# Patient Record
Sex: Male | Born: 1995 | Race: White | Hispanic: No | Marital: Married | Smoking: Current every day smoker
Health system: Southern US, Community
[De-identification: ages and names within clinical notes are randomized; demographics above are authoritative.]

## PROBLEM LIST (undated history)

## (undated) DIAGNOSIS — Z789 Other specified health status: Secondary | ICD-10-CM

## (undated) DIAGNOSIS — W3400XA Accidental discharge from unspecified firearms or gun, initial encounter: Secondary | ICD-10-CM

## (undated) HISTORY — PX: OTHER SURGICAL HISTORY: SHX169

---
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
## 2019-06-03 ENCOUNTER — Emergency Department (HOSPITAL_COMMUNITY): Payer: Self-pay

## 2019-06-03 ENCOUNTER — Encounter (HOSPITAL_COMMUNITY): Payer: Self-pay

## 2019-06-03 ENCOUNTER — Encounter (HOSPITAL_COMMUNITY)
Admission: EM | Disposition: A | Discharge status: Home / Self Care | Payer: Self-pay | Source: Home / Self Care | Attending: Vascular Surgery

## 2019-06-03 ENCOUNTER — Emergency Department (HOSPITAL_COMMUNITY): Payer: Self-pay | Admitting: Certified Registered"

## 2019-06-03 ENCOUNTER — Other Ambulatory Visit: Payer: Self-pay

## 2019-06-03 ENCOUNTER — Inpatient Hospital Stay (HOSPITAL_COMMUNITY)
Admission: EM | Admit: 2019-06-03 | Discharge: 2019-06-06 | DRG: 907 | Disposition: A | Discharge status: Home / Self Care | Payer: Self-pay | Attending: Vascular Surgery | Admitting: Vascular Surgery

## 2019-06-03 DIAGNOSIS — M79651 Pain in right thigh: Secondary | ICD-10-CM

## 2019-06-03 DIAGNOSIS — I998 Other disorder of circulatory system: Secondary | ICD-10-CM | POA: Diagnosis present

## 2019-06-03 DIAGNOSIS — S71131A Puncture wound without foreign body, right thigh, initial encounter: Secondary | ICD-10-CM | POA: Diagnosis present

## 2019-06-03 DIAGNOSIS — Z20828 Contact with and (suspected) exposure to other viral communicable diseases: Secondary | ICD-10-CM | POA: Diagnosis present

## 2019-06-03 DIAGNOSIS — Y909 Presence of alcohol in blood, level not specified: Secondary | ICD-10-CM | POA: Diagnosis present

## 2019-06-03 DIAGNOSIS — I743 Embolism and thrombosis of arteries of the lower extremities: Secondary | ICD-10-CM | POA: Diagnosis present

## 2019-06-03 DIAGNOSIS — W3400XA Accidental discharge from unspecified firearms or gun, initial encounter: Secondary | ICD-10-CM

## 2019-06-03 DIAGNOSIS — F17220 Nicotine dependence, chewing tobacco, uncomplicated: Secondary | ICD-10-CM | POA: Diagnosis present

## 2019-06-03 DIAGNOSIS — I959 Hypotension, unspecified: Secondary | ICD-10-CM | POA: Diagnosis present

## 2019-06-03 DIAGNOSIS — S75111A Minor laceration of femoral vein at hip and thigh level, right leg, initial encounter: Secondary | ICD-10-CM | POA: Diagnosis present

## 2019-06-03 DIAGNOSIS — R0989 Other specified symptoms and signs involving the circulatory and respiratory systems: Secondary | ICD-10-CM

## 2019-06-03 DIAGNOSIS — F10129 Alcohol abuse with intoxication, unspecified: Secondary | ICD-10-CM | POA: Diagnosis present

## 2019-06-03 DIAGNOSIS — S75001A Unspecified injury of femoral artery, right leg, initial encounter: Secondary | ICD-10-CM

## 2019-06-03 DIAGNOSIS — S75091A Other specified injury of femoral artery, right leg, initial encounter: Principal | ICD-10-CM | POA: Diagnosis present

## 2019-06-03 HISTORY — DX: Other specified health status: Z78.9

## 2019-06-03 HISTORY — PX: FEMORAL-POPLITEAL BYPASS GRAFT: SHX937

## 2019-06-03 HISTORY — DX: Accidental discharge from unspecified firearms or gun, initial encounter: W34.00XA

## 2019-06-03 LAB — CBC
HCT: 40.5 % (ref 39.0–52.0)
Hemoglobin: 13.3 g/dL (ref 13.0–17.0)
MCH: 32 pg (ref 26.0–34.0)
MCHC: 32.8 g/dL (ref 30.0–36.0)
MCV: 97.6 fL (ref 80.0–100.0)
Platelets: 290 10*3/uL (ref 150–400)
RBC: 4.15 MIL/uL — ABNORMAL LOW (ref 4.22–5.81)
RDW: 12.3 % (ref 11.5–15.5)
WBC: 14.1 10*3/uL — ABNORMAL HIGH (ref 4.0–10.5)
nRBC: 0 % (ref 0.0–0.2)

## 2019-06-03 LAB — ETHANOL: Alcohol, Ethyl (B): 203 mg/dL — ABNORMAL HIGH (ref <10)

## 2019-06-03 LAB — COMPREHENSIVE METABOLIC PANEL
ALT: 16 U/L (ref 0–44)
AST: 17 U/L (ref 15–41)
Albumin: 3.6 g/dL (ref 3.5–5.0)
Alkaline Phosphatase: 50 U/L (ref 38–126)
Anion gap: 16 — ABNORMAL HIGH (ref 5–15)
BUN: 14 mg/dL (ref 6–20)
CO2: 13 mmol/L — ABNORMAL LOW (ref 22–32)
Calcium: 7.8 mg/dL — ABNORMAL LOW (ref 8.9–10.3)
Chloride: 108 mmol/L (ref 98–111)
Creatinine, Ser: 1.7 mg/dL — ABNORMAL HIGH (ref 0.61–1.24)
GFR calc Af Amer: >60 mL/min (ref >60)
GFR calc non Af Amer: 55 mL/min — ABNORMAL LOW (ref >60)
Glucose, Bld: 285 mg/dL — ABNORMAL HIGH (ref 70–99)
Potassium: 2.7 mmol/L — CL (ref 3.5–5.1)
Sodium: 137 mmol/L (ref 135–145)
Total Bilirubin: 0.4 mg/dL (ref 0.3–1.2)
Total Protein: 5.9 g/dL — ABNORMAL LOW (ref 6.5–8.1)

## 2019-06-03 LAB — SARS CORONAVIRUS 2 BY RT PCR (HOSPITAL ORDER, PERFORMED IN ~~LOC~~ HOSPITAL LAB): SARS Coronavirus 2: NEGATIVE

## 2019-06-03 LAB — I-STAT CHEM 8, ED
BUN: 15 mg/dL (ref 6–20)
Calcium, Ion: 1 mmol/L — ABNORMAL LOW (ref 1.15–1.40)
Chloride: 106 mmol/L (ref 98–111)
Creatinine, Ser: 1.8 mg/dL — ABNORMAL HIGH (ref 0.61–1.24)
Glucose, Bld: 282 mg/dL — ABNORMAL HIGH (ref 70–99)
HCT: 41 % (ref 39.0–52.0)
Hemoglobin: 13.9 g/dL (ref 13.0–17.0)
Potassium: 2.8 mmol/L — ABNORMAL LOW (ref 3.5–5.1)
Sodium: 139 mmol/L (ref 135–145)
TCO2: 15 mmol/L — ABNORMAL LOW (ref 22–32)

## 2019-06-03 LAB — PROTIME-INR
INR: 1.2 (ref 0.8–1.2)
Prothrombin Time: 15 seconds (ref 11.4–15.2)

## 2019-06-03 LAB — LACTIC ACID, PLASMA: Lactic Acid, Venous: 5.7 mmol/L (ref 0.5–1.9)

## 2019-06-03 LAB — CDS SEROLOGY

## 2019-06-03 SURGERY — BYPASS GRAFT FEMORAL-POPLITEAL ARTERY
Anesthesia: General | Site: Leg Upper | Laterality: Right

## 2019-06-03 MED ORDER — SUFENTANIL CITRATE 50 MCG/ML IV SOLN
INTRAVENOUS | Status: AC
  Filled 2019-06-03: qty 1

## 2019-06-03 MED ORDER — PROPOFOL 10 MG/ML IV BOLUS
INTRAVENOUS | Status: AC
  Filled 2019-06-03: qty 20

## 2019-06-03 MED ORDER — SODIUM CHLORIDE 0.9 % IV SOLN
INTRAVENOUS | Status: AC
  Filled 2019-06-03: qty 1.2

## 2019-06-03 MED ORDER — TETANUS-DIPHTH-ACELL PERTUSSIS 5-2.5-18.5 LF-MCG/0.5 IM SUSP
0.5000 mL | Freq: Once | INTRAMUSCULAR | Status: AC
  Administered 2019-06-03: 0.5 mL via INTRAMUSCULAR
  Filled 2019-06-03: qty 0.5

## 2019-06-03 MED ORDER — THROMBIN (RECOMBINANT) 20000 UNITS EX SOLR
CUTANEOUS | Status: AC
  Filled 2019-06-03: qty 20000

## 2019-06-03 MED ORDER — MIDAZOLAM HCL 2 MG/2ML IJ SOLN
INTRAMUSCULAR | Status: AC
  Filled 2019-06-03: qty 2

## 2019-06-03 SURGICAL SUPPLY — 54 items
BNDG ELASTIC 4X5.8 VLCR STR LF (GAUZE/BANDAGES/DRESSINGS) IMPLANT
CANISTER SUCT 3000ML PPV (MISCELLANEOUS) ×3 IMPLANT
CATH EMB 3FR 80CM (CATHETERS) ×2 IMPLANT
CLIP VESOCCLUDE MED 24/CT (CLIP) ×3 IMPLANT
CLIP VESOCCLUDE SM WIDE 24/CT (CLIP) ×3 IMPLANT
COVER WAND RF STERILE (DRAPES) ×1 IMPLANT
DERMABOND ADVANCED (GAUZE/BANDAGES/DRESSINGS) ×2
DERMABOND ADVANCED .7 DNX12 (GAUZE/BANDAGES/DRESSINGS) ×1 IMPLANT
DRAIN CHANNEL 15F RND FF W/TCR (WOUND CARE) IMPLANT
DRAPE X-RAY CASS 24X20 (DRAPES) IMPLANT
DRSG COVADERM 4X10 (GAUZE/BANDAGES/DRESSINGS) ×2 IMPLANT
ELECT REM PT RETURN 9FT ADLT (ELECTROSURGICAL) ×3
ELECTRODE REM PT RTRN 9FT ADLT (ELECTROSURGICAL) ×1 IMPLANT
EVACUATOR SILICONE 100CC (DRAIN) IMPLANT
GAUZE SPONGE 4X4 12PLY STRL LF (GAUZE/BANDAGES/DRESSINGS) ×2 IMPLANT
GLOVE BIO SURGEON STRL SZ7.5 (GLOVE) ×3 IMPLANT
GLOVE BIOGEL PI IND STRL 6.5 (GLOVE) IMPLANT
GLOVE BIOGEL PI IND STRL 7.0 (GLOVE) IMPLANT
GLOVE BIOGEL PI IND STRL 7.5 (GLOVE) IMPLANT
GLOVE BIOGEL PI INDICATOR 6.5 (GLOVE) ×2
GLOVE BIOGEL PI INDICATOR 7.0 (GLOVE) ×2
GLOVE BIOGEL PI INDICATOR 7.5 (GLOVE) ×2
GLOVE ECLIPSE 7.0 STRL STRAW (GLOVE) ×2 IMPLANT
GLOVE SURG SS PI 7.5 STRL IVOR (GLOVE) ×2 IMPLANT
GOWN STRL REUS W/ TWL LRG LVL3 (GOWN DISPOSABLE) ×2 IMPLANT
GOWN STRL REUS W/ TWL XL LVL3 (GOWN DISPOSABLE) ×1 IMPLANT
GOWN STRL REUS W/TWL LRG LVL3 (GOWN DISPOSABLE) ×4
GOWN STRL REUS W/TWL XL LVL3 (GOWN DISPOSABLE) ×2
HEMOSTAT SNOW SURGICEL 2X4 (HEMOSTASIS) IMPLANT
KIT BASIN OR (CUSTOM PROCEDURE TRAY) ×3 IMPLANT
KIT TURNOVER KIT B (KITS) ×3 IMPLANT
MARKER GRAFT CORONARY BYPASS (MISCELLANEOUS) IMPLANT
NS IRRIG 1000ML POUR BTL (IV SOLUTION) ×6 IMPLANT
PACK PERIPHERAL VASCULAR (CUSTOM PROCEDURE TRAY) ×3 IMPLANT
PAD ARMBOARD 7.5X6 YLW CONV (MISCELLANEOUS) ×6 IMPLANT
SET COLLECT BLD 21X3/4 12 (NEEDLE) IMPLANT
STOPCOCK 4 WAY LG BORE MALE ST (IV SETS) IMPLANT
SUT ETHILON 3 0 PS 1 (SUTURE) IMPLANT
SUT PROLENE 5 0 C 1 24 (SUTURE) ×5 IMPLANT
SUT PROLENE 6 0 BV (SUTURE) ×5 IMPLANT
SUT SILK 3 0 (SUTURE)
SUT SILK 3-0 18XBRD TIE 12 (SUTURE) IMPLANT
SUT VIC AB 2-0 CT1 27 (SUTURE) ×4
SUT VIC AB 2-0 CT1 TAPERPNT 27 (SUTURE) ×2 IMPLANT
SUT VIC AB 3-0 SH 27 (SUTURE) ×4
SUT VIC AB 3-0 SH 27X BRD (SUTURE) ×2 IMPLANT
SUT VICRYL 4-0 PS2 18IN ABS (SUTURE) ×4 IMPLANT
SYR 3ML LL SCALE MARK (SYRINGE) ×2 IMPLANT
TAPE CLOTH SURG 4X10 WHT LF (GAUZE/BANDAGES/DRESSINGS) ×2 IMPLANT
TOWEL GREEN STERILE (TOWEL DISPOSABLE) ×3 IMPLANT
TRAY FOLEY MTR SLVR 16FR STAT (SET/KITS/TRAYS/PACK) IMPLANT
TUBING EXTENTION W/L.L. (IV SETS) IMPLANT
UNDERPAD 30X30 (UNDERPADS AND DIAPERS) ×3 IMPLANT
WATER STERILE IRR 1000ML POUR (IV SOLUTION) ×3 IMPLANT

## 2019-06-03 NOTE — ED Notes (Signed)
Vascular at bedside

## 2019-06-03 NOTE — ED Notes (Signed)
Paged Vascular surg to Dr Dian Situ
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
## 2019-06-03 NOTE — ED Notes (Signed)
Waiting on COVID result then pt to go to OR, tourniquet in place. Pt resting comfortably, easily aroused

## 2019-06-03 NOTE — Consult Note (Signed)
Responded to page, pt unavailable, no family present, staff will page again if further chaplain services needed.  Rev. Ludwig Tugwell Chaplain 

## 2019-06-03 NOTE — Anesthesia Preprocedure Evaluation (Signed)
Anesthesia Evaluation  Patient identified by MRN, date of birth, ID band Patient awake    Reviewed: Allergy & Precautions, NPO status , Patient's Chart, lab work & pertinent test results  Airway Mallampati: II  TM Distance: >3 FB Neck ROM: Full    Dental no notable dental hx. (+) Dental Advisory Given   Pulmonary Current Smoker,    Pulmonary exam normal        Cardiovascular negative cardio ROS Normal cardiovascular exam     Neuro/Psych negative neurological ROS  negative psych ROS   GI/Hepatic negative GI ROS, Neg liver ROS,   Endo/Other  negative endocrine ROS  Renal/GU negative Renal ROS  negative genitourinary   Musculoskeletal negative musculoskeletal ROS (+)   Abdominal   Peds negative pediatric ROS (+)  Hematology negative hematology ROS (+)   Anesthesia Other Findings   Reproductive/Obstetrics negative OB ROS                             Anesthesia Physical Anesthesia Plan  ASA: II and emergent  Anesthesia Plan: General   Post-op Pain Management:    Induction: Intravenous, Rapid sequence and Cricoid pressure planned  PONV Risk Score and Plan: 2  Airway Management Planned: Oral ETT  Additional Equipment:   Intra-op Plan:   Post-operative Plan: Extubation in OR  Informed Consent: I have reviewed the patients History and Physical, chart, labs and discussed the procedure including the risks, benefits and alternatives for the proposed anesthesia with the patient or authorized representative who has indicated his/her understanding and acceptance.     Dental advisory given  Plan Discussed with: CRNA and Anesthesiologist  Anesthesia Plan Comments:         Anesthesia Quick Evaluation
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
## 2019-06-04 ENCOUNTER — Encounter (HOSPITAL_COMMUNITY): Payer: Self-pay | Admitting: Vascular Surgery

## 2019-06-04 DIAGNOSIS — W3400XA Accidental discharge from unspecified firearms or gun, initial encounter: Secondary | ICD-10-CM

## 2019-06-04 DIAGNOSIS — Y249XXA Unspecified firearm discharge, undetermined intent, initial encounter: Secondary | ICD-10-CM

## 2019-06-04 DIAGNOSIS — S75091A Other specified injury of femoral artery, right leg, initial encounter: Secondary | ICD-10-CM

## 2019-06-04 DIAGNOSIS — S75191A Other specified injury of femoral vein at hip and thigh level, right leg, initial encounter: Secondary | ICD-10-CM

## 2019-06-04 LAB — BASIC METABOLIC PANEL
Anion gap: 9 (ref 5–15)
BUN: 13 mg/dL (ref 6–20)
CO2: 20 mmol/L — ABNORMAL LOW (ref 22–32)
Calcium: 8.2 mg/dL — ABNORMAL LOW (ref 8.9–10.3)
Chloride: 108 mmol/L (ref 98–111)
Creatinine, Ser: 1.09 mg/dL (ref 0.61–1.24)
GFR calc Af Amer: >60 mL/min (ref >60)
GFR calc non Af Amer: >60 mL/min (ref >60)
Glucose, Bld: 179 mg/dL — ABNORMAL HIGH (ref 70–99)
Potassium: 4.2 mmol/L (ref 3.5–5.1)
Sodium: 137 mmol/L (ref 135–145)

## 2019-06-04 LAB — URINALYSIS, ROUTINE W REFLEX MICROSCOPIC
Bilirubin Urine: NEGATIVE
Glucose, UA: NEGATIVE mg/dL
Hgb urine dipstick: NEGATIVE
Ketones, ur: NEGATIVE mg/dL
Leukocytes,Ua: NEGATIVE
Nitrite: NEGATIVE
Protein, ur: NEGATIVE mg/dL
Specific Gravity, Urine: 1.019 (ref 1.005–1.030)
pH: 5 (ref 5.0–8.0)

## 2019-06-04 LAB — CBC
HCT: 30.8 % — ABNORMAL LOW (ref 39.0–52.0)
Hemoglobin: 10.2 g/dL — ABNORMAL LOW (ref 13.0–17.0)
MCH: 30.3 pg (ref 26.0–34.0)
MCHC: 33.1 g/dL (ref 30.0–36.0)
MCV: 91.4 fL (ref 80.0–100.0)
Platelets: 166 10*3/uL (ref 150–400)
RBC: 3.37 MIL/uL — ABNORMAL LOW (ref 4.22–5.81)
RDW: 14.5 % (ref 11.5–15.5)
WBC: 15.2 10*3/uL — ABNORMAL HIGH (ref 4.0–10.5)
nRBC: 0 % (ref 0.0–0.2)

## 2019-06-04 LAB — BPAM RBC
Blood Product Expiration Date: 202008172359
Blood Product Expiration Date: 202008172359
Blood Product Expiration Date: 202008172359
Blood Product Expiration Date: 202008172359
ISSUE DATE / TIME: 202007202222
ISSUE DATE / TIME: 202007202222
ISSUE DATE / TIME: 202007210324
ISSUE DATE / TIME: 202007210324
Unit Type and Rh: 5100
Unit Type and Rh: 5100
Unit Type and Rh: 5100
Unit Type and Rh: 5100

## 2019-06-04 LAB — PREPARE FRESH FROZEN PLASMA
Unit division: 0
Unit division: 0

## 2019-06-04 LAB — POCT I-STAT 4, (NA,K, GLUC, HGB,HCT)
Glucose, Bld: 100 mg/dL — ABNORMAL HIGH (ref 70–99)
HCT: 30 % — ABNORMAL LOW (ref 39.0–52.0)
Hemoglobin: 10.2 g/dL — ABNORMAL LOW (ref 13.0–17.0)
Potassium: 3.8 mmol/L (ref 3.5–5.1)
Sodium: 143 mmol/L (ref 135–145)

## 2019-06-04 LAB — TYPE AND SCREEN
ABO/RH(D): A NEG
Antibody Screen: NEGATIVE
Unit division: 0
Unit division: 0
Unit division: 0
Unit division: 0

## 2019-06-04 LAB — BPAM FFP
Blood Product Expiration Date: 202008082359
Blood Product Expiration Date: 202008082359
ISSUE DATE / TIME: 202007202222
ISSUE DATE / TIME: 202007202222
Unit Type and Rh: 600
Unit Type and Rh: 6200

## 2019-06-04 LAB — GLUCOSE, CAPILLARY: Glucose-Capillary: 117 mg/dL — ABNORMAL HIGH (ref 70–99)

## 2019-06-04 LAB — MRSA PCR SCREENING: MRSA by PCR: NEGATIVE

## 2019-06-04 LAB — BLOOD PRODUCT ORDER (VERBAL) VERIFICATION

## 2019-06-04 LAB — ABO/RH: ABO/RH(D): A NEG

## 2019-06-04 LAB — HIV ANTIBODY (ROUTINE TESTING W REFLEX): HIV Screen 4th Generation wRfx: NONREACTIVE

## 2019-06-04 MED ORDER — 0.9 % SODIUM CHLORIDE (POUR BTL) OPTIME
TOPICAL | Status: DC | PRN
  Administered 2019-06-04: 01:00:00 1000 mL

## 2019-06-04 MED ORDER — LACTATED RINGERS IV SOLN
INTRAVENOUS | Status: DC | PRN
  Administered 2019-06-03 – 2019-06-04 (×2): via INTRAVENOUS

## 2019-06-04 MED ORDER — ENOXAPARIN SODIUM 40 MG/0.4ML ~~LOC~~ SOLN
40.0000 mg | Freq: Every day | SUBCUTANEOUS | Status: DC
  Administered 2019-06-04 – 2019-06-06 (×3): 40 mg via SUBCUTANEOUS
  Filled 2019-06-04 (×4): qty 0.4

## 2019-06-04 MED ORDER — ROCURONIUM BROMIDE 10 MG/ML (PF) SYRINGE
PREFILLED_SYRINGE | INTRAVENOUS | Status: AC
  Filled 2019-06-04: qty 10

## 2019-06-04 MED ORDER — LIDOCAINE 2% (20 MG/ML) 5 ML SYRINGE
INTRAMUSCULAR | Status: AC
  Filled 2019-06-04: qty 5

## 2019-06-04 MED ORDER — PROMETHAZINE HCL 25 MG/ML IJ SOLN
INTRAMUSCULAR | Status: AC
  Filled 2019-06-04: qty 1

## 2019-06-04 MED ORDER — HYDROMORPHONE HCL 1 MG/ML IJ SOLN
INTRAMUSCULAR | Status: AC
  Administered 2019-06-04: 0.5 mg via INTRAVENOUS
  Filled 2019-06-04: qty 1

## 2019-06-04 MED ORDER — OXYCODONE HCL 5 MG PO TABS
5.0000 mg | ORAL_TABLET | ORAL | Status: DC | PRN
  Administered 2019-06-04 – 2019-06-06 (×10): 5 mg via ORAL
  Filled 2019-06-04 (×11): qty 1

## 2019-06-04 MED ORDER — ALBUMIN HUMAN 5 % IV SOLN
INTRAVENOUS | Status: DC | PRN
  Administered 2019-06-04 (×2): via INTRAVENOUS

## 2019-06-04 MED ORDER — MIDAZOLAM HCL 2 MG/2ML IJ SOLN
INTRAMUSCULAR | Status: DC | PRN
  Administered 2019-06-03: 2 mg via INTRAVENOUS

## 2019-06-04 MED ORDER — DOCUSATE SODIUM 100 MG PO CAPS
100.0000 mg | ORAL_CAPSULE | Freq: Two times a day (BID) | ORAL | Status: DC
  Administered 2019-06-04 – 2019-06-06 (×5): 100 mg via ORAL
  Filled 2019-06-04 (×6): qty 1

## 2019-06-04 MED ORDER — PANTOPRAZOLE SODIUM 40 MG PO TBEC
40.0000 mg | DELAYED_RELEASE_TABLET | Freq: Every day | ORAL | Status: DC
  Administered 2019-06-04 – 2019-06-06 (×3): 40 mg via ORAL
  Filled 2019-06-04 (×3): qty 1

## 2019-06-04 MED ORDER — SODIUM CHLORIDE 0.9 % IV SOLN
INTRAVENOUS | Status: DC | PRN
  Administered 2019-06-04: 500 mL

## 2019-06-04 MED ORDER — ONDANSETRON HCL 4 MG/2ML IJ SOLN
INTRAMUSCULAR | Status: DC | PRN
  Administered 2019-06-04: 4 mg via INTRAVENOUS

## 2019-06-04 MED ORDER — HYDRALAZINE HCL 20 MG/ML IJ SOLN: 10.0000 mg | INTRAMUSCULAR | Status: DC | PRN

## 2019-06-04 MED ORDER — METOPROLOL TARTRATE 5 MG/5ML IV SOLN: 5.0000 mg | Freq: Four times a day (QID) | INTRAVENOUS | Status: DC | PRN

## 2019-06-04 MED ORDER — ROCURONIUM 10MG/ML (10ML) SYRINGE FOR MEDFUSION PUMP - OPTIME
INTRAVENOUS | Status: DC | PRN
  Administered 2019-06-04: 40 mg via INTRAVENOUS

## 2019-06-04 MED ORDER — HYDROMORPHONE HCL 1 MG/ML IJ SOLN
0.5000 mg | INTRAMUSCULAR | Status: DC | PRN
  Administered 2019-06-04 – 2019-06-06 (×16): 0.5 mg via INTRAVENOUS
  Filled 2019-06-04 (×17): qty 0.5

## 2019-06-04 MED ORDER — ONDANSETRON 4 MG PO TBDP
4.0000 mg | ORAL_TABLET | Freq: Four times a day (QID) | ORAL | Status: DC | PRN
  Filled 2019-06-04: qty 1

## 2019-06-04 MED ORDER — CEFAZOLIN SODIUM 1 G IJ SOLR
INTRAMUSCULAR | Status: AC
  Filled 2019-06-04: qty 20

## 2019-06-04 MED ORDER — PANTOPRAZOLE SODIUM 40 MG IV SOLR: 40.0000 mg | Freq: Every day | INTRAVENOUS | Status: DC

## 2019-06-04 MED ORDER — PROPOFOL 10 MG/ML IV BOLUS
INTRAVENOUS | Status: DC | PRN
  Administered 2019-06-04: 150 mg via INTRAVENOUS

## 2019-06-04 MED ORDER — CEFAZOLIN SODIUM-DEXTROSE 2-3 GM-%(50ML) IV SOLR
INTRAVENOUS | Status: DC | PRN
  Administered 2019-06-04: 2 g via INTRAVENOUS

## 2019-06-04 MED ORDER — SODIUM CHLORIDE (PF) 0.9 % IJ SOLN
INTRAMUSCULAR | Status: AC
  Filled 2019-06-04: qty 10

## 2019-06-04 MED ORDER — BISACODYL 10 MG RE SUPP
10.0000 mg | Freq: Every day | RECTAL | Status: DC | PRN
  Filled 2019-06-04: qty 1

## 2019-06-04 MED ORDER — ACETAMINOPHEN 325 MG PO TABS
650.0000 mg | ORAL_TABLET | ORAL | Status: DC | PRN
  Filled 2019-06-04: qty 2

## 2019-06-04 MED ORDER — SUGAMMADEX SODIUM 200 MG/2ML IV SOLN
INTRAVENOUS | Status: DC | PRN
  Administered 2019-06-04: 200 mg via INTRAVENOUS

## 2019-06-04 MED ORDER — DEXAMETHASONE SODIUM PHOSPHATE 10 MG/ML IJ SOLN
INTRAMUSCULAR | Status: DC | PRN
  Administered 2019-06-04: 10 mg via INTRAVENOUS

## 2019-06-04 MED ORDER — PROMETHAZINE HCL 25 MG/ML IJ SOLN: 6.2500 mg | INTRAMUSCULAR | Status: DC | PRN

## 2019-06-04 MED ORDER — SUCCINYLCHOLINE CHLORIDE 200 MG/10ML IV SOSY
PREFILLED_SYRINGE | INTRAVENOUS | Status: AC
  Filled 2019-06-04: qty 10

## 2019-06-04 MED ORDER — SUFENTANIL CITRATE 50 MCG/ML IV SOLN
INTRAVENOUS | Status: DC | PRN
  Administered 2019-06-04: 20 ug via INTRAVENOUS
  Administered 2019-06-04: 10 ug via INTRAVENOUS

## 2019-06-04 MED ORDER — ASPIRIN EC 81 MG PO TBEC
81.0000 mg | DELAYED_RELEASE_TABLET | Freq: Every day | ORAL | Status: DC
  Administered 2019-06-05 – 2019-06-06 (×2): 81 mg via ORAL
  Filled 2019-06-04 (×2): qty 1

## 2019-06-04 MED ORDER — GABAPENTIN 300 MG PO CAPS
300.0000 mg | ORAL_CAPSULE | Freq: Three times a day (TID) | ORAL | Status: DC
  Administered 2019-06-04 – 2019-06-06 (×7): 300 mg via ORAL
  Filled 2019-06-04 (×7): qty 1

## 2019-06-04 MED ORDER — LIDOCAINE 2% (20 MG/ML) 5 ML SYRINGE
INTRAMUSCULAR | Status: DC | PRN
  Administered 2019-06-04: 100 mg via INTRAVENOUS

## 2019-06-04 MED ORDER — EPHEDRINE 5 MG/ML INJ
INTRAVENOUS | Status: AC
  Filled 2019-06-04: qty 20

## 2019-06-04 MED ORDER — DEXAMETHASONE SODIUM PHOSPHATE 10 MG/ML IJ SOLN
INTRAMUSCULAR | Status: AC
  Filled 2019-06-04: qty 1

## 2019-06-04 MED ORDER — SUCCINYLCHOLINE CHLORIDE 200 MG/10ML IV SOSY
PREFILLED_SYRINGE | INTRAVENOUS | Status: DC | PRN
  Administered 2019-06-04: 140 mg via INTRAVENOUS

## 2019-06-04 MED ORDER — HYDROMORPHONE HCL 1 MG/ML IJ SOLN
0.2500 mg | INTRAMUSCULAR | Status: DC | PRN
  Administered 2019-06-04 (×3): 0.5 mg via INTRAVENOUS

## 2019-06-04 MED ORDER — ONDANSETRON HCL 4 MG/2ML IJ SOLN
4.0000 mg | Freq: Four times a day (QID) | INTRAMUSCULAR | Status: DC | PRN
  Administered 2019-06-04: 4 mg via INTRAVENOUS
  Filled 2019-06-04 (×2): qty 2

## 2019-06-04 MED ORDER — ONDANSETRON HCL 4 MG/2ML IJ SOLN
INTRAMUSCULAR | Status: AC
  Filled 2019-06-04: qty 2

## 2019-06-04 MED ORDER — EPHEDRINE SULFATE 50 MG/ML IJ SOLN
INTRAMUSCULAR | Status: DC | PRN
  Administered 2019-06-04 (×2): 20 mg via INTRAVENOUS
  Administered 2019-06-04 (×2): 10 mg via INTRAVENOUS
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
## 2019-06-04 NOTE — Anesthesia Procedure Notes (Signed)
Procedure Name: Intubation Date/Time: 06/04/2019 12:04 AM Performed by: Claris Che, CRNA Pre-anesthesia Checklist: Patient identified, Emergency Drugs available, Suction available, Patient being monitored and Timeout performed Patient Re-evaluated:Patient Re-evaluated prior to induction Oxygen Delivery Method: Circle system utilized Preoxygenation: Pre-oxygenation with 100% oxygen Induction Type: IV induction, Rapid sequence and Cricoid Pressure applied Laryngoscope Size: Mac and 3 Grade View: Grade II Tube type: Oral Tube size: 8.0 mm Number of attempts: 1 Airway Equipment and Method: Stylet Placement Confirmation: ETT inserted through vocal cords under direct vision,  positive ETCO2 and breath sounds checked- equal and bilateral Secured at: 24 cm Tube secured with: Tape Dental Injury: Teeth and Oropharynx as per pre-operative assessment

## 2019-06-04 NOTE — Anesthesia Postprocedure Evaluation (Signed)
Anesthesia Post Note  Patient: Francis Padilla  Procedure(s) Performed: RIGHTY LOWER EXTREMITY THROMBECTOMY; INTERPOSITION BYPASS GRAFT RIGHT SUPERFICIAL FEMORAL ARTERY (Right Leg Upper)     Patient location during evaluation: PACU Anesthesia Type: General Level of consciousness: sedated Pain management: pain level controlled Vital Signs Assessment: post-procedure vital signs reviewed and stable Respiratory status: spontaneous breathing and respiratory function stable Cardiovascular status: stable Postop Assessment: no apparent nausea or vomiting Anesthetic complications: no    Last Vitals:  Vitals:   06/04/19 0305 06/04/19 0321  BP: 120/74 126/76  Pulse: 82 100  Resp: 14 15  Temp:    SpO2: 97% 100%    Last Pain:  Vitals:   06/04/19 0204  TempSrc:   PainSc: 3                  [employer] DANIEL

## 2019-06-04 NOTE — Transfer of Care (Signed)
Immediate Anesthesia Transfer of Care Note  Patient: Francis Padilla  Procedure(s) Performed: RIGHTY LOWER EXTREMITY THROMBECTOMY; INTERPOSITION BYPASS GRAFT RIGHT SUPERFICIAL FEMORAL ARTERY (Right Leg Upper)  Patient Location: PACU  Anesthesia Type:General  Level of Consciousness: oriented, drowsy, patient cooperative and responds to stimulation  Airway & Oxygen Therapy: Patient Spontanous Breathing and Patient connected to face mask oxygen  Post-op Assessment: Report given to RN, Post -op Vital signs reviewed and stable and Patient moving all extremities X 4  Post vital signs: Reviewed and stable  Last Vitals:  Vitals Value Taken Time  BP 113/84 06/04/19 0204  Temp    Pulse 81 06/04/19 0208  Resp 14 06/04/19 0208  SpO2 100 % 06/04/19 0208  Vitals shown include unvalidated device data.  Last Pain:  Vitals:   06/03/19 2225  TempSrc:   PainSc: 10-Worst pain ever         Complications: No apparent anesthesia complications

## 2019-06-04 NOTE — Plan of Care (Signed)
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
## 2019-06-05 ENCOUNTER — Inpatient Hospital Stay (HOSPITAL_COMMUNITY): Payer: Self-pay
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
## 2019-06-05 NOTE — Plan of Care (Signed)
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
## 2019-06-06 MED ORDER — DOCUSATE SODIUM 100 MG PO CAPS: 100.0000 mg | ORAL_CAPSULE | Freq: Two times a day (BID) | ORAL | 0 refills | Status: DC

## 2019-06-06 MED ORDER — OXYCODONE HCL 5 MG PO TABS: 5.0000 mg | ORAL_TABLET | ORAL | 0 refills | Status: DC | PRN

## 2019-06-06 MED ORDER — ASPIRIN 81 MG PO TBEC: 81.0000 mg | DELAYED_RELEASE_TABLET | Freq: Every day | ORAL | 3 refills | Status: DC

## 2019-06-06 MED FILL — ASPIRIN LOW DOSE 81 MG TBEC: 81 | 30 days supply | Qty: 30 | Fill #0

## 2019-06-06 MED FILL — DOK 100 MG CAPS: 100 | 10 days supply | Qty: 20 | Fill #0

## 2019-06-06 MED FILL — oxyCODONE HCL 5 MG TABS: 5 | 7 days supply | Qty: 30 | Fill #0
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
## 2019-06-06 NOTE — Progress Notes (Addendum)
Patient given verbal discharge instructions. Paper copy provided to the patient. Patient had no further questions. Transitional pharmacy [facility].  IV's removed per orders. VSS at discharge. Patient discharged by RN via wheelchair through [employer] entrance to private vehicle.

## 2019-06-06 NOTE — Discharge Instructions (Signed)
 [facility]  Discharge instructions  Lower Extremity Bypass Surgery  Please refer to the following instruction for your post-procedure care. Your surgeon or physician assistant will discuss any changes with you.  Activity  You are encouraged to walk as much as you can. You can slowly return to normal activities during the month after your surgery. Avoid strenuous activity and heavy lifting until your doctor tells you it's OK. Avoid activities such as vacuuming or swinging a golf club. Do not drive until your doctor give the OK and you are no longer taking prescription pain medications. It is also normal to have difficulty with sleep habits, eating and bowel movement after surgery. These will go away with time.  Bathing/Showering  You may shower after you go home. Do not soak in a bathtub, hot tub, or swim until the incision heals completely.  Incision Care  Clean your incision with mild soap and water. Shower every day. Pat the area dry with a clean towel. You do not need a bandage unless otherwise instructed. Do not apply any ointments or creams to your incision. If you have open wounds you will be instructed how to care for them or a visiting nurse may be arranged for you. If you have staples or sutures along your incision they will be removed at your post-op appointment. You may have skin glue on your incision. Do not peel it off. It will come off on its own in about one week. If you have a great deal of moisture in your groin, use a gauze help keep this area dry.  Diet  Resume your normal diet. There are no special food restrictions following this procedure. A low fat/ low cholesterol diet is recommended for all patients with vascular disease. In order to heal from your surgery, it is CRITICAL to get adequate nutrition. Your body requires vitamins, minerals, and protein. Vegetables are the best source of vitamins and minerals. Vegetables also provide the  perfect balance of protein. Processed food has little nutritional value, so try to avoid this.  Medications  Resume taking all your medications unless your doctor or nurse practitioner tells you not to. If your incision is causing pain, you may take over-the-counter pain relievers such as acetaminophen (Tylenol). If you were prescribed a stronger pain medication, please aware these medication can cause nausea and constipation. Prevent nausea by taking the medication with a snack or meal. Avoid constipation by drinking plenty of fluids and eating foods with high amount of fiber, such as fruits, vegetables, and grains. Take Colase 100 mg (an over-the-counter stool softener) twice a day as needed for constipation. Do not take Tylenol if you are taking prescription pain medications.  Follow Up  Our office will schedule a follow up appointment 2-3 weeks following discharge.  Please call us immediately for any of the following conditions  Severe or worsening pain in your legs or feet while at rest or while walking Increase pain, redness, warmth, or drainage (pus) from your incision site(s) Fever of 101 degree or higher The swelling in your leg with the bypass suddenly worsens and becomes more painful than when you were in the hospital If you have been instructed to feel your graft pulse then you should do so every day. If you can no longer feel this pulse, call the office immediately. Not all patients are given this instruction.  Leg swelling is common after leg bypass surgery.  The swelling should improve over a few months   following surgery. To improve the swelling, you may elevate your legs above the level of your heart while you are sitting or resting. Your surgeon or physician assistant may ask you to apply an ACE wrap or wear compression (TED) stockings to help to reduce swelling.  Reduce your risk of vascular disease  Stop smoking. If you would like help call [facility] at 1-800-QUIT-NOW  (1-800-784-8669) or [facility] at 336-586-4000.  Manage your cholesterol Maintain a desired weight Control your diabetes weight Control your diabetes Keep your blood pressure down  If you have any questions, please call the office at [phone]   

## 2019-06-06 NOTE — TOC Transition Note (Signed)
Transition of Care [facility]) - CM/SW Discharge Note   Patient Details  Name: Francis Padilla MRN: [id number] Date of Birth: [birth year]  Transition of Care [facility]) CM/SW Contact:  Zenon Mayo, RN Phone Number: 06/06/2019, 10:27 AM   Clinical Narrative:    Patient is for discharge, he states his transportation will help to pay for his medications that will be filled by the [facility].  NCM assisted with Match Letter. Also has a follow up apt with the [facility] and [facility].   Final next level of care: Home/Self Care Barriers to Discharge: No Barriers Identified   Patient Goals and CMS Choice Patient states their goals for this hospitalization and ongoing recovery are:: to get better   Choice offered to / list presented to : NA  Discharge Placement                       Discharge Plan and Services                DME Arranged: (NA)         HH Arranged: NA          Social Determinants of Health (SDOH) Interventions     Readmission Risk Interventions No flowsheet data found.
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
## 2019-06-07 ENCOUNTER — Other Ambulatory Visit: Payer: Self-pay | Admitting: Vascular Surgery

## 2019-06-07 DIAGNOSIS — W3400XA Accidental discharge from unspecified firearms or gun, initial encounter: Secondary | ICD-10-CM

## 2019-06-10 ENCOUNTER — Observation Stay (HOSPITAL_COMMUNITY)
Admission: EM | Admit: 2019-06-10 | Discharge: 2019-06-12 | Disposition: A | Discharge status: Home / Self Care | Payer: Self-pay | Attending: Vascular Surgery | Admitting: Vascular Surgery

## 2019-06-10 ENCOUNTER — Encounter (HOSPITAL_COMMUNITY): Payer: Self-pay | Admitting: Emergency Medicine

## 2019-06-10 ENCOUNTER — Other Ambulatory Visit: Payer: Self-pay

## 2019-06-10 DIAGNOSIS — L539 Erythematous condition, unspecified: Secondary | ICD-10-CM | POA: Insufficient documentation

## 2019-06-10 DIAGNOSIS — M79604 Pain in right leg: Principal | ICD-10-CM | POA: Insufficient documentation

## 2019-06-10 DIAGNOSIS — F17200 Nicotine dependence, unspecified, uncomplicated: Secondary | ICD-10-CM | POA: Insufficient documentation

## 2019-06-10 DIAGNOSIS — S71131A Puncture wound without foreign body, right thigh, initial encounter: Secondary | ICD-10-CM

## 2019-06-10 DIAGNOSIS — Z7982 Long term (current) use of aspirin: Secondary | ICD-10-CM | POA: Insufficient documentation

## 2019-06-10 DIAGNOSIS — Z20828 Contact with and (suspected) exposure to other viral communicable diseases: Secondary | ICD-10-CM | POA: Insufficient documentation

## 2019-06-10 DIAGNOSIS — Z79899 Other long term (current) drug therapy: Secondary | ICD-10-CM | POA: Insufficient documentation

## 2019-06-10 DIAGNOSIS — G8918 Other acute postprocedural pain: Secondary | ICD-10-CM | POA: Insufficient documentation

## 2019-06-10 DIAGNOSIS — Z0184 Encounter for antibody response examination: Secondary | ICD-10-CM | POA: Insufficient documentation
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
## 2019-06-11 ENCOUNTER — Emergency Department (HOSPITAL_COMMUNITY): Payer: Self-pay

## 2019-06-11 ENCOUNTER — Other Ambulatory Visit: Payer: Self-pay

## 2019-06-11 ENCOUNTER — Encounter (HOSPITAL_COMMUNITY): Payer: Self-pay | Admitting: General Practice

## 2019-06-11 DIAGNOSIS — S71131A Puncture wound without foreign body, right thigh, initial encounter: Secondary | ICD-10-CM

## 2019-06-11 LAB — CBC
HCT: 26.2 % — ABNORMAL LOW (ref 39.0–52.0)
HCT: 25.7 % — ABNORMAL LOW (ref 39.0–52.0)
Hemoglobin: 8.3 g/dL — ABNORMAL LOW (ref 13.0–17.0)
Hemoglobin: 8.3 g/dL — ABNORMAL LOW (ref 13.0–17.0)
MCH: 30.6 pg (ref 26.0–34.0)
MCH: 31.3 pg (ref 26.0–34.0)
MCHC: 31.7 g/dL (ref 30.0–36.0)
MCHC: 32.3 g/dL (ref 30.0–36.0)
MCV: 96.7 fL (ref 80.0–100.0)
MCV: 97 fL (ref 80.0–100.0)
Platelets: 276 10*3/uL (ref 150–400)
Platelets: 242 10*3/uL (ref 150–400)
RBC: 2.71 MIL/uL — ABNORMAL LOW (ref 4.22–5.81)
RBC: 2.65 MIL/uL — ABNORMAL LOW (ref 4.22–5.81)
RDW: 14.3 % (ref 11.5–15.5)
RDW: 14.5 % (ref 11.5–15.5)
WBC: 9.2 10*3/uL (ref 4.0–10.5)
WBC: 8.6 10*3/uL (ref 4.0–10.5)
nRBC: 0.2 % (ref 0.0–0.2)
nRBC: 0 % (ref 0.0–0.2)

## 2019-06-11 LAB — BASIC METABOLIC PANEL
Anion gap: 8 (ref 5–15)
BUN: 9 mg/dL (ref 6–20)
CO2: 24 mmol/L (ref 22–32)
Calcium: 8.6 mg/dL — ABNORMAL LOW (ref 8.9–10.3)
Chloride: 104 mmol/L (ref 98–111)
Creatinine, Ser: 0.87 mg/dL (ref 0.61–1.24)
GFR calc Af Amer: >60 mL/min (ref >60)
GFR calc non Af Amer: >60 mL/min (ref >60)
Glucose, Bld: 99 mg/dL (ref 70–99)
Potassium: 4.1 mmol/L (ref 3.5–5.1)
Sodium: 136 mmol/L (ref 135–145)

## 2019-06-11 LAB — ABO/RH: ABO/RH(D): A NEG

## 2019-06-11 LAB — TYPE AND SCREEN
ABO/RH(D): A NEG
Antibody Screen: NEGATIVE

## 2019-06-11 LAB — SARS CORONAVIRUS 2 BY RT PCR (HOSPITAL ORDER, PERFORMED IN ~~LOC~~ HOSPITAL LAB): SARS Coronavirus 2: NEGATIVE

## 2019-06-11 MED ORDER — PNEUMOCOCCAL VAC POLYVALENT 25 MCG/0.5ML IJ INJ: 0.5000 mL | INJECTION | INTRAMUSCULAR | Status: DC

## 2019-06-11 MED ORDER — LABETALOL HCL 5 MG/ML IV SOLN: 10.0000 mg | INTRAVENOUS | Status: DC | PRN

## 2019-06-11 MED ORDER — GUAIFENESIN-DM 100-10 MG/5ML PO SYRP: 15.0000 mL | ORAL_SOLUTION | ORAL | Status: DC | PRN

## 2019-06-11 MED ORDER — MORPHINE SULFATE (PF) 2 MG/ML IV SOLN: 2.0000 mg | INTRAVENOUS | Status: DC | PRN

## 2019-06-11 MED ORDER — POTASSIUM CHLORIDE CRYS ER 20 MEQ PO TBCR: 20.0000 meq | EXTENDED_RELEASE_TABLET | Freq: Once | ORAL | Status: DC

## 2019-06-11 MED ORDER — ACETAMINOPHEN 325 MG RE SUPP
325.0000 mg | RECTAL | Status: DC | PRN
  Filled 2019-06-11: qty 2

## 2019-06-11 MED ORDER — CEFAZOLIN SODIUM-DEXTROSE 1-4 GM/50ML-% IV SOLN
1.0000 g | Freq: Three times a day (TID) | INTRAVENOUS | Status: DC
  Administered 2019-06-11 (×3): 1 g via INTRAVENOUS
  Filled 2019-06-11 (×5): qty 50

## 2019-06-11 MED ORDER — METOPROLOL TARTRATE 5 MG/5ML IV SOLN: 2.0000 mg | INTRAVENOUS | Status: DC | PRN

## 2019-06-11 MED ORDER — PANTOPRAZOLE SODIUM 40 MG PO TBEC
40.0000 mg | DELAYED_RELEASE_TABLET | Freq: Every day | ORAL | Status: DC
  Administered 2019-06-11 – 2019-06-12 (×2): 40 mg via ORAL
  Filled 2019-06-11 (×2): qty 1

## 2019-06-11 MED ORDER — ONDANSETRON HCL 4 MG/2ML IJ SOLN: 4.0000 mg | Freq: Four times a day (QID) | INTRAMUSCULAR | Status: DC | PRN

## 2019-06-11 MED ORDER — DIPHENHYDRAMINE HCL 25 MG PO CAPS
50.0000 mg | ORAL_CAPSULE | Freq: Four times a day (QID) | ORAL | Status: DC | PRN
  Administered 2019-06-11: 50 mg via ORAL
  Filled 2019-06-11: qty 2

## 2019-06-11 MED ORDER — DOCUSATE SODIUM 100 MG PO CAPS
100.0000 mg | ORAL_CAPSULE | Freq: Two times a day (BID) | ORAL | Status: DC
  Administered 2019-06-11 – 2019-06-12 (×3): 100 mg via ORAL
  Filled 2019-06-11 (×3): qty 1

## 2019-06-11 MED ORDER — POLYETHYLENE GLYCOL 3350 17 G PO PACK: 17.0000 g | PACK | Freq: Every day | ORAL | Status: DC | PRN

## 2019-06-11 MED ORDER — MORPHINE SULFATE (PF) 4 MG/ML IV SOLN
4.0000 mg | Freq: Once | INTRAVENOUS | Status: AC
  Administered 2019-06-11: 03:00:00 4 mg via INTRAVENOUS
  Filled 2019-06-11: qty 1

## 2019-06-11 MED ORDER — PHENOL 1.4 % MT LIQD
1.0000 | OROMUCOSAL | Status: DC | PRN
  Filled 2019-06-11: qty 177

## 2019-06-11 MED ORDER — IOHEXOL 350 MG/ML SOLN
100.0000 mL | Freq: Once | INTRAVENOUS | Status: AC | PRN
  Administered 2019-06-11: 100 mL via INTRAVENOUS

## 2019-06-11 MED ORDER — OXYCODONE HCL 5 MG PO TABS
5.0000 mg | ORAL_TABLET | ORAL | Status: DC | PRN
  Administered 2019-06-11 (×3): 10 mg via ORAL
  Administered 2019-06-11: 5 mg via ORAL
  Administered 2019-06-12: 10 mg via ORAL
  Filled 2019-06-11: qty 1
  Filled 2019-06-11 (×4): qty 2

## 2019-06-11 MED ORDER — ACETAMINOPHEN 325 MG PO TABS: 325.0000 mg | ORAL_TABLET | ORAL | Status: DC | PRN

## 2019-06-11 MED ORDER — ALUM & MAG HYDROXIDE-SIMETH 200-200-20 MG/5ML PO SUSP: 15.0000 mL | ORAL | Status: DC | PRN

## 2019-06-11 MED ORDER — ONDANSETRON HCL 4 MG/2ML IJ SOLN
4.0000 mg | Freq: Once | INTRAMUSCULAR | Status: AC
  Administered 2019-06-11: 4 mg via INTRAVENOUS
  Filled 2019-06-11: qty 2

## 2019-06-11 MED ORDER — HYDRALAZINE HCL 20 MG/ML IJ SOLN: 5.0000 mg | INTRAMUSCULAR | Status: DC | PRN
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
## 2019-06-11 NOTE — ED Notes (Signed)
ED Provider at bedside. 
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
## 2019-06-11 NOTE — ED Notes (Signed)
(  Lunch Tray)Ordered @ 1011.

## 2019-06-11 NOTE — Progress Notes (Signed)
MD on call paged patient stated he has had no sleep since admission. New orders given will continue to monitor. Arthor Captain LPN
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
## 2019-06-11 NOTE — Progress Notes (Signed)
Patient admitted to unit from emergency department, VSS. Patient settled in bed and oriented to unit and hospital routine. Pt resting comfortably in bed with call bell in reach and side rails up. Instructed patient to utilize call bell for assistance. Will continue to monitor closely for remainder of shift. 

## 2019-06-11 NOTE — ED Notes (Signed)
Breakfast Tray ordered  

## 2019-06-12 MED ORDER — OXYCODONE HCL 5 MG PO TABS: 5.0000 mg | ORAL_TABLET | ORAL | 0 refills | Status: DC | PRN

## 2019-06-12 MED ORDER — CEPHALEXIN 500 MG PO CAPS: 500.0000 mg | ORAL_CAPSULE | Freq: Two times a day (BID) | ORAL | 0 refills | Status: DC

## 2019-06-12 MED FILL — oxyCODONE HCL 5 MG TABS: 5 | 5 days supply | Qty: 30 | Fill #0

## 2019-06-12 MED FILL — CEPHALEXIN 500 MG CAPSULE: 500 | 7 days supply | Qty: 14 | Fill #0
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
## 2019-06-12 NOTE — Progress Notes (Signed)
Discharge instructions given. Pt verbalized understanding and all questions were answered.  

## 2019-06-12 NOTE — Plan of Care (Signed)
  Problem: Education: Goal: Knowledge of General Education information will improve Description Including pain rating scale, medication(s)/side effects and non-pharmacologic comfort measures Outcome: Progressing   
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
## 2019-07-02 ENCOUNTER — Other Ambulatory Visit: Payer: Self-pay

## 2019-07-02 ENCOUNTER — Ambulatory Visit: Payer: Self-pay | Attending: Critical Care Medicine | Admitting: Critical Care Medicine

## 2019-07-02 DIAGNOSIS — S71131S Puncture wound without foreign body, right thigh, sequela: Secondary | ICD-10-CM

## 2019-07-02 DIAGNOSIS — W3400XS Accidental discharge from unspecified firearms or gun, sequela: Secondary | ICD-10-CM

## 2019-07-02 NOTE — Progress Notes (Signed)
Patient ID: Francis Padilla, male   DOB: 04/29/1996, 23 y.o.   MRN: [id number]  The patient did not connect by phone and was a NO SHOW

## 2019-07-04 ENCOUNTER — Telehealth (HOSPITAL_COMMUNITY): Payer: Self-pay | Admitting: Rehabilitation

## 2019-07-04 NOTE — Telephone Encounter (Signed)

## 2019-07-05 ENCOUNTER — Ambulatory Visit (HOSPITAL_COMMUNITY): Payer: MEDICAID | Attending: Vascular Surgery

## 2019-07-05 ENCOUNTER — Encounter: Payer: Self-pay | Admitting: Vascular Surgery

## 2019-07-05 ENCOUNTER — Encounter: Payer: Self-pay | Admitting: Family
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
## 2020-04-02 IMAGING — DX PORTABLE CHEST - 1 VIEW
1 series · 1 of 1 positions shown · non-contrast
Comparison: None.

CLINICAL DATA: Abnormal lung sounds right worse than left.  Smoker.

EXAM:
PORTABLE CHEST 1 VIEW

[chest ap]
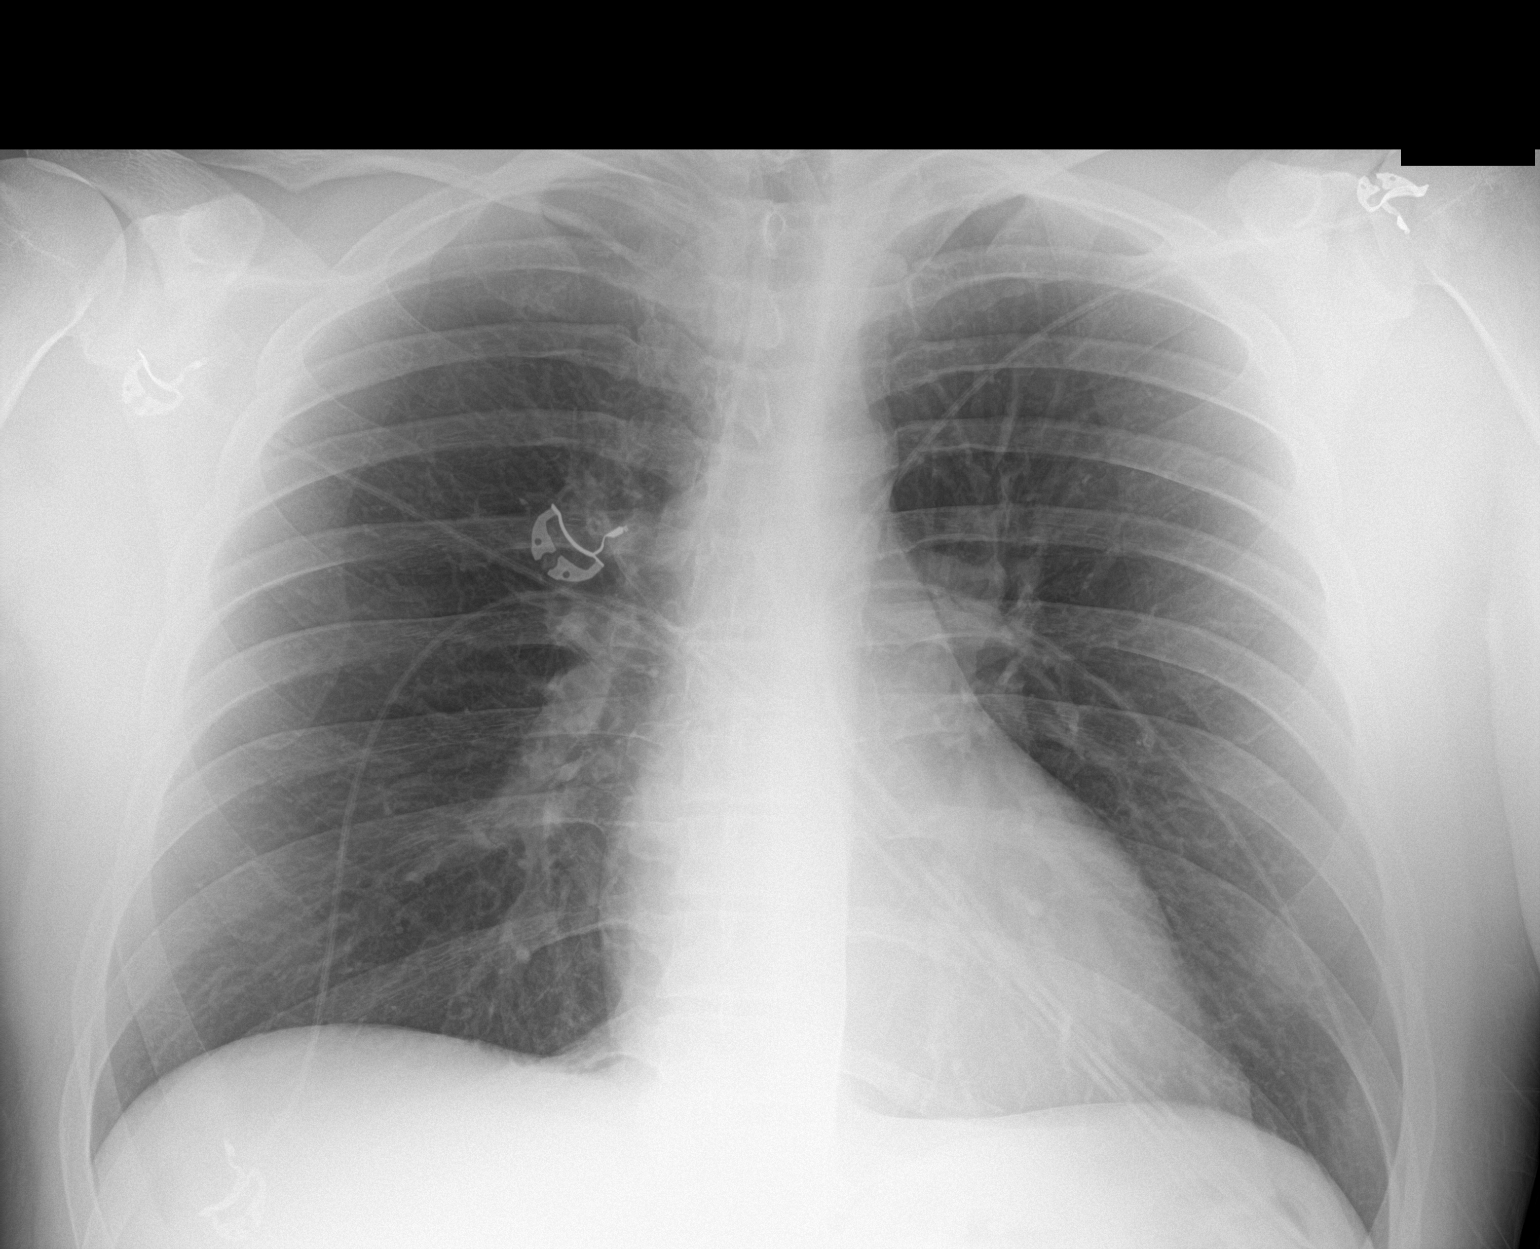

[1 of 1 positions shown; findings below may reference images not displayed]

FINDINGS: The heart size and mediastinal contours are within normal limits.
Both lungs are clear. The visualized skeletal structures are
unremarkable.
IMPRESSION: No active disease.

## 2024-05-18 ENCOUNTER — Inpatient Hospital Stay (HOSPITAL_COMMUNITY)
Admission: AD | Admit: 2024-05-18 | Discharge: 2024-05-24 | DRG: 885 | Disposition: A | Discharge status: Against Medical Advice | Source: Intra-hospital

## 2024-05-18 DIAGNOSIS — F10239 Alcohol dependence with withdrawal, unspecified: Secondary | ICD-10-CM | POA: Diagnosis present

## 2024-05-18 DIAGNOSIS — F10229 Alcohol dependence with intoxication, unspecified: Secondary | ICD-10-CM | POA: Diagnosis present

## 2024-05-18 DIAGNOSIS — F17210 Nicotine dependence, cigarettes, uncomplicated: Secondary | ICD-10-CM | POA: Diagnosis present

## 2024-05-18 DIAGNOSIS — Z91410 Personal history of adult physical and sexual abuse: Secondary | ICD-10-CM | POA: Diagnosis not present

## 2024-05-18 DIAGNOSIS — F1020 Alcohol dependence, uncomplicated: Principal | ICD-10-CM | POA: Diagnosis present

## 2024-05-18 DIAGNOSIS — R45851 Suicidal ideations: Secondary | ICD-10-CM | POA: Diagnosis present

## 2024-05-18 DIAGNOSIS — F332 Major depressive disorder, recurrent severe without psychotic features: Principal | ICD-10-CM | POA: Diagnosis present

## 2024-05-18 DIAGNOSIS — F411 Generalized anxiety disorder: Secondary | ICD-10-CM | POA: Diagnosis present

## 2024-05-18 DIAGNOSIS — F1029 Alcohol dependence with unspecified alcohol-induced disorder: Secondary | ICD-10-CM | POA: Diagnosis not present

## 2024-05-18 DIAGNOSIS — Z5329 Procedure and treatment not carried out because of patient's decision for other reasons: Secondary | ICD-10-CM | POA: Diagnosis not present

## 2024-05-18 MED ORDER — LORAZEPAM 1 MG PO TABS: 1.0000 mg | ORAL_TABLET | Freq: Every day | ORAL | Status: DC

## 2024-05-18 MED ORDER — LORAZEPAM 2 MG/ML IJ SOLN: 2.0000 mg | Freq: Three times a day (TID) | INTRAMUSCULAR | Status: DC | PRN

## 2024-05-18 MED ORDER — ONDANSETRON 4 MG PO TBDP: 4.0000 mg | ORAL_TABLET | Freq: Four times a day (QID) | ORAL | Status: AC | PRN

## 2024-05-18 MED ORDER — LORAZEPAM 1 MG PO TABS
1.0000 mg | ORAL_TABLET | Freq: Two times a day (BID) | ORAL | Status: AC
  Administered 2024-05-21 – 2024-05-22 (×2): 1 mg via ORAL
  Filled 2024-05-18 (×2): qty 1

## 2024-05-18 MED ORDER — ADULT MULTIVITAMIN W/MINERALS CH
1.0000 | ORAL_TABLET | Freq: Every day | ORAL | Status: DC
  Administered 2024-05-19 – 2024-05-24 (×6): 1 via ORAL
  Filled 2024-05-18 (×6): qty 1

## 2024-05-18 MED ORDER — HALOPERIDOL LACTATE 5 MG/ML IJ SOLN: 10.0000 mg | Freq: Three times a day (TID) | INTRAMUSCULAR | Status: DC | PRN

## 2024-05-18 MED ORDER — THIAMINE HCL 100 MG/ML IJ SOLN: 100.0000 mg | Freq: Once | INTRAMUSCULAR | Status: DC

## 2024-05-18 MED ORDER — HYDROXYZINE HCL 25 MG PO TABS
25.0000 mg | ORAL_TABLET | Freq: Four times a day (QID) | ORAL | Status: AC | PRN
  Administered 2024-05-19 (×2): 25 mg via ORAL
  Filled 2024-05-18 (×2): qty 1

## 2024-05-18 MED ORDER — ACETAMINOPHEN 325 MG PO TABS: 650.0000 mg | ORAL_TABLET | Freq: Four times a day (QID) | ORAL | Status: DC | PRN

## 2024-05-18 MED ORDER — LORAZEPAM 1 MG PO TABS
1.0000 mg | ORAL_TABLET | Freq: Four times a day (QID) | ORAL | Status: AC | PRN
  Administered 2024-05-19 (×3): 1 mg via ORAL
  Filled 2024-05-18 (×3): qty 1

## 2024-05-18 MED ORDER — MAGNESIUM HYDROXIDE 400 MG/5ML PO SUSP: 30.0000 mL | Freq: Every day | ORAL | Status: DC | PRN

## 2024-05-18 MED ORDER — VITAMIN B-1 100 MG PO TABS
100.0000 mg | ORAL_TABLET | Freq: Every day | ORAL | Status: DC
  Administered 2024-05-19 – 2024-05-24 (×6): 100 mg via ORAL
  Filled 2024-05-18 (×5): qty 1

## 2024-05-18 MED ORDER — HYDROXYZINE HCL 25 MG PO TABS
25.0000 mg | ORAL_TABLET | Freq: Three times a day (TID) | ORAL | Status: DC | PRN
  Administered 2024-05-20 – 2024-05-23 (×5): 25 mg via ORAL
  Filled 2024-05-18 (×6): qty 1

## 2024-05-18 MED ORDER — DIPHENHYDRAMINE HCL 50 MG/ML IJ SOLN: 50.0000 mg | Freq: Three times a day (TID) | INTRAMUSCULAR | Status: DC | PRN

## 2024-05-18 MED ORDER — LORAZEPAM 1 MG PO TABS
1.0000 mg | ORAL_TABLET | Freq: Three times a day (TID) | ORAL | Status: AC
  Administered 2024-05-20 – 2024-05-21 (×3): 1 mg via ORAL
  Filled 2024-05-18 (×3): qty 1

## 2024-05-18 MED ORDER — HALOPERIDOL LACTATE 5 MG/ML IJ SOLN: 5.0000 mg | Freq: Three times a day (TID) | INTRAMUSCULAR | Status: DC | PRN

## 2024-05-18 MED ORDER — DIPHENHYDRAMINE HCL 25 MG PO CAPS: 50.0000 mg | ORAL_CAPSULE | Freq: Three times a day (TID) | ORAL | Status: DC | PRN

## 2024-05-18 MED ORDER — HALOPERIDOL 5 MG PO TABS
5.0000 mg | ORAL_TABLET | Freq: Three times a day (TID) | ORAL | Status: DC | PRN
  Filled 2024-05-18: qty 1

## 2024-05-18 MED ORDER — LOPERAMIDE HCL 2 MG PO CAPS: 2.0000 mg | ORAL_CAPSULE | ORAL | Status: AC | PRN

## 2024-05-18 MED ORDER — LORAZEPAM 1 MG PO TABS
1.0000 mg | ORAL_TABLET | Freq: Four times a day (QID) | ORAL | Status: AC
  Administered 2024-05-19 – 2024-05-20 (×6): 1 mg via ORAL
  Filled 2024-05-18 (×6): qty 1

## 2024-05-18 MED ORDER — TRAZODONE HCL 50 MG PO TABS
50.0000 mg | ORAL_TABLET | Freq: Every evening | ORAL | Status: DC | PRN
  Administered 2024-05-19 – 2024-05-20 (×3): 50 mg via ORAL
  Filled 2024-05-18 (×3): qty 1

## 2024-05-18 MED ORDER — ALUM & MAG HYDROXIDE-SIMETH 200-200-20 MG/5ML PO SUSP: 30.0000 mL | ORAL | Status: DC | PRN

## 2024-05-19 ENCOUNTER — Encounter (HOSPITAL_COMMUNITY): Payer: Self-pay | Admitting: Psychiatry

## 2024-05-19 ENCOUNTER — Other Ambulatory Visit: Payer: Self-pay

## 2024-05-19 DIAGNOSIS — F332 Major depressive disorder, recurrent severe without psychotic features: Principal | ICD-10-CM

## 2024-05-19 DIAGNOSIS — F411 Generalized anxiety disorder: Secondary | ICD-10-CM | POA: Insufficient documentation

## 2024-05-19 MED ORDER — SERTRALINE HCL 25 MG PO TABS
25.0000 mg | ORAL_TABLET | Freq: Every day | ORAL | Status: DC
  Administered 2024-05-19 – 2024-05-20 (×2): 25 mg via ORAL
  Filled 2024-05-19 (×2): qty 1

## 2024-05-19 MED ORDER — NICOTINE POLACRILEX 2 MG MT GUM
2.0000 mg | CHEWING_GUM | OROMUCOSAL | Status: DC | PRN
  Administered 2024-05-19 – 2024-05-24 (×15): 2 mg via ORAL
  Filled 2024-05-19 (×10): qty 1

## 2024-05-19 MED ORDER — RISPERIDONE 0.25 MG PO TABS: 0.2500 mg | ORAL_TABLET | Freq: Every day | ORAL | Status: DC

## 2024-05-19 MED ORDER — RISPERIDONE 0.5 MG PO TABS
0.5000 mg | ORAL_TABLET | Freq: Two times a day (BID) | ORAL | Status: DC
  Administered 2024-05-19 – 2024-05-22 (×7): 0.5 mg via ORAL
  Filled 2024-05-19 (×7): qty 1

## 2024-05-19 NOTE — [facility] Group Notes (Addendum)
 [facility] Group Notes:  (Nursing/MHT/Case Management/Adjunct)  Date:  05/19/2024  Time:  2000  Type of Therapy:  wrap up group  Participation Level:  Active  Participation Quality:  Appropriate, Attentive, Sharing, and Supportive  Affect:  Flat  Cognitive:  Alert  Insight:  Improving  Engagement in Group:  Engaged  Modes of Intervention:  Clarification, Education, and Support  Summary of Progress/Problems: Positive thinking and positive change were discussed.   Lenora Manuelita RAMAN 05/19/2024, 9:26 PM

## 2024-05-19 NOTE — Progress Notes (Addendum)
 D.Pt continues to present with CIWA score >12- complaints of agitation, anxiety, tremors. Pt has been cooperative with no behavioral issues noted on the unit Pt currently denies SI/HI and AVH and agrees to contact staff before acting on any harmful thoughts.  A. Labs and vitals monitored. Pt given and educated on medications. Pt supported emotionally and encouraged to express concerns and ask questions.   R. Pt remains safe with 15 minute checks. Will continue POC.    05/19/24 1300  Psych Admission Type (Psych Patients Only)  Admission Status Involuntary  Psychosocial Assessment  Patient Complaints Depression;Substance abuse  Eye Contact Fair  Facial Expression Sad  Affect Appropriate to circumstance  Speech Logical/coherent  Interaction Assertive  Motor Activity Fidgety  Appearance/Hygiene In scrubs  Behavior Characteristics Cooperative;Appropriate to situation  Mood Depressed  Thought Process  Coherency WDL  Content WDL  Delusions None reported or observed  Perception WDL  Hallucination None reported or observed  Judgment Impaired  Confusion None  Danger to Self  Current suicidal ideation? Passive  Self-Injurious Behavior No self-injurious ideation or behavior indicators observed or expressed   Agreement Not to Harm Self Yes  Description of Agreement agreed to contact staff before acting on harmful thoughts  Danger to Others  Danger to Others None reported or observed

## 2024-05-19 NOTE — Plan of Care (Signed)
   Problem: Education: Goal: Knowledge of [place] [facility] Education information/materials will improve Outcome: Progressing

## 2024-05-19 NOTE — Plan of Care (Signed)
   Problem: Coping: Goal: Ability to verbalize frustrations and anger appropriately will improve Outcome: Progressing Goal: Ability to demonstrate self-control will improve Outcome: Progressing

## 2024-05-19 NOTE — Group Note (Signed)
 LCSW Group Therapy Note  Group Date: 05/19/2024 Start Time: 1030 End Time: 1130   Type of Therapy and Topic:  Group Therapy - Healthy vs Unhealthy Coping Skills  Participation Level:  Did Not Attend   Description of Group The focus of this group was to determine what unhealthy coping techniques typically are used by group members and what healthy coping techniques would be helpful in coping with various problems. Patients were guided in becoming aware of the differences between healthy and unhealthy coping techniques. Patients were asked to identify 2-3 healthy coping skills they would like to learn to use more effectively.  Therapeutic Goals Patients learned that coping is what human beings do all day long to deal with various situations in their lives Patients defined and discussed healthy vs unhealthy coping techniques Patients identified their preferred coping techniques and identified whether these were healthy or unhealthy Patients determined 2-3 healthy coping skills they would like to become more familiar with and use more often. Patients provided support and ideas to each other   Summary of Patient Progress:  did not attend   Therapeutic Modalities Cognitive Behavioral Therapy Motivational Interviewing  Francis Padilla, LCSWA 05/19/2024  4:14 PM
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
## 2024-05-19 NOTE — Progress Notes (Signed)
   05/19/24 2000  Psych Admission Type (Psych Patients Only)  Admission Status Involuntary  Psychosocial Assessment  Patient Complaints Anxiety  Eye Contact Fair  Facial Expression Anxious  Affect Appropriate to circumstance  Speech Logical/coherent  Interaction Cautious  Motor Activity Other (Comment) (WNL)  Appearance/Hygiene In scrubs  Behavior Characteristics Appropriate to situation;Calm  Mood Anxious  Thought Process  Coherency WDL  Content WDL  Delusions None reported or observed  Perception WDL  Hallucination None reported or observed  Judgment Impaired  Confusion None  Danger to Self  Current suicidal ideation? Passive  Agreement Not to Harm Self Yes  Description of Agreement Notify Staff  Danger to Others  Danger to Others None reported or observed

## 2024-05-19 NOTE — Group Note (Signed)
 Date:  05/19/2024 Time:  9:53 AM  Group Topic/Focus:  Goals Group:   The focus of this group is to help patients establish daily goals to achieve during treatment and discuss how the patient can incorporate goal setting into their daily lives to aide in recovery.    Participation Level:  Did Not Attend  Participation Quality:    Affect:    Cognitive:    Insight:   Engagement in Group:    Modes of Intervention:    Additional Comments:    Francis Padilla Francis Padilla 05/19/2024, 9:53 AM

## 2024-05-19 NOTE — Tx Team (Signed)
 Initial Treatment Plan 05/19/2024 12:23 AM Francis Padilla FMW:[id number]    PATIENT STRESSORS: Financial difficulties   Health problems   Loss of mother died last year, daughter in foster care   Marital or family conflict   Substance abuse   Traumatic event     PATIENT STRENGTHS: Average or above average intelligence  Communication skills    PATIENT IDENTIFIED PROBLEMS: Depression  Suicidal Ideation  Substance abuse (ETOH)          Try to get better       DISCHARGE CRITERIA:  Improved stabilization in mood, thinking, and/or behavior Motivation to continue treatment in a less acute level of care Need for constant or close observation no longer present Verbal commitment to aftercare and medication compliance Withdrawal symptoms are absent or subacute and managed without 24-hour nursing intervention  PRELIMINARY DISCHARGE PLAN: Attend 12-step recovery group Outpatient therapy Return to previous living arrangement  PATIENT/FAMILY INVOLVEMENT: This treatment plan has been presented to and reviewed with the patient, Francis Padilla.  The patient and family have been given the opportunity to ask questions and make suggestions.  Effie Naomie Norris, RN 05/19/2024, 12:23 AM
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
## 2024-05-20 ENCOUNTER — Encounter (HOSPITAL_COMMUNITY): Payer: Self-pay

## 2024-05-20 DIAGNOSIS — F411 Generalized anxiety disorder: Secondary | ICD-10-CM

## 2024-05-20 DIAGNOSIS — F1020 Alcohol dependence, uncomplicated: Secondary | ICD-10-CM

## 2024-05-20 LAB — COMPREHENSIVE METABOLIC PANEL WITH GFR
ALT: 28 U/L (ref 0–44)
AST: 24 U/L (ref 15–41)
Albumin: 4.3 g/dL (ref 3.5–5.0)
Alkaline Phosphatase: 59 U/L (ref 38–126)
Anion gap: 9 (ref 5–15)
BUN: 12 mg/dL (ref 6–20)
CO2: 23 mmol/L (ref 22–32)
Calcium: 9.1 mg/dL (ref 8.9–10.3)
Chloride: 105 mmol/L (ref 98–111)
Creatinine, Ser: 0.89 mg/dL (ref 0.61–1.24)
GFR, Estimated: >60 mL/min (ref >60)
Glucose, Bld: 97 mg/dL (ref 70–99)
Potassium: 4 mmol/L (ref 3.5–5.1)
Sodium: 137 mmol/L (ref 135–145)
Total Bilirubin: 1 mg/dL (ref 0.0–1.2)
Total Protein: 7.6 g/dL (ref 6.5–8.1)

## 2024-05-20 LAB — CBC WITH DIFFERENTIAL/PLATELET
Abs Immature Granulocytes: 0.05 K/uL (ref 0.00–0.07)
Basophils Absolute: 0 K/uL (ref 0.0–0.1)
Basophils Relative: 1 %
Eosinophils Absolute: 0.1 K/uL (ref 0.0–0.5)
Eosinophils Relative: 1 %
HCT: 48.2 % (ref 39.0–52.0)
Hemoglobin: 16 g/dL (ref 13.0–17.0)
Immature Granulocytes: 1 %
Lymphocytes Relative: 46 %
Lymphs Abs: 4 K/uL (ref 0.7–4.0)
MCH: 31.6 pg (ref 26.0–34.0)
MCHC: 33.2 g/dL (ref 30.0–36.0)
MCV: 95.3 fL (ref 80.0–100.0)
Monocytes Absolute: 0.8 K/uL (ref 0.1–1.0)
Monocytes Relative: 10 %
Neutro Abs: 3.6 K/uL (ref 1.7–7.7)
Neutrophils Relative %: 41 %
Platelets: 233 K/uL (ref 150–400)
RBC: 5.06 MIL/uL (ref 4.22–5.81)
RDW: 12.4 % (ref 11.5–15.5)
WBC: 8.6 K/uL (ref 4.0–10.5)
nRBC: 0 % (ref 0.0–0.2)

## 2024-05-20 LAB — LIPID PANEL
Cholesterol: 195 mg/dL (ref 0–200)
HDL: 69 mg/dL (ref >40)
LDL Cholesterol: 111 mg/dL — ABNORMAL HIGH (ref 0–99)
Total CHOL/HDL Ratio: 2.8 ratio
Triglycerides: 74 mg/dL (ref <150)
VLDL: 15 mg/dL (ref 0–40)

## 2024-05-20 LAB — HEMOGLOBIN A1C
Hgb A1c MFr Bld: 6.2 % — ABNORMAL HIGH (ref 4.8–5.6)
Mean Plasma Glucose: 131.24 mg/dL

## 2024-05-20 LAB — TSH: TSH: 2.972 u[IU]/mL (ref 0.350–4.500)

## 2024-05-20 MED ORDER — SERTRALINE HCL 50 MG PO TABS
50.0000 mg | ORAL_TABLET | Freq: Every day | ORAL | Status: DC
  Administered 2024-05-21 – 2024-05-22 (×2): 50 mg via ORAL
  Filled 2024-05-20 (×2): qty 1

## 2024-05-20 MED ORDER — HALOPERIDOL 5 MG PO TABS
5.0000 mg | ORAL_TABLET | Freq: Once | ORAL | Status: AC
  Administered 2024-05-20: 5 mg via ORAL

## 2024-05-20 NOTE — Plan of Care (Signed)
   Problem: Activity: Goal: Interest or engagement in activities will improve Outcome: Progressing   Problem: Coping: Goal: Ability to verbalize frustrations and anger appropriately will improve Outcome: Progressing   Problem: Safety: Goal: Periods of time without injury will increase Outcome: Progressing
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
## 2024-05-20 NOTE — Progress Notes (Signed)
 0615 EKG completed and placed in the chart. Pt tolerated well. No noted distress. Care continues during 7p-7a shift.

## 2024-05-20 NOTE — Plan of Care (Signed)
  Problem: Activity: Goal: Interest or engagement in activities will improve 05/20/2024 1407 by Cresenciano Joaquin KIDD, RN Outcome: Progressing 05/20/2024 1333 by Cresenciano Joaquin KIDD, RN Outcome: Progressing   Problem: Coping: Goal: Ability to verbalize frustrations and anger appropriately will improve 05/20/2024 1407 by Cresenciano Joaquin KIDD, RN Outcome: Progressing 05/20/2024 1333 by Cresenciano Joaquin KIDD, RN Outcome: Progressing   Problem: Safety: Goal: Periods of time without injury will increase 05/20/2024 1407 by Cresenciano Joaquin KIDD, RN Outcome: Progressing 05/20/2024 1333 by Cresenciano Joaquin KIDD, RN Outcome: Progressing

## 2024-05-20 NOTE — Group Note (Signed)
 Recreation Therapy Group Note   Group Topic:Communication  Group Date: 05/20/2024 Start Time: 0935 End Time: 1000 Facilitators: Francis Padilla, Francis Padilla,Francis Padilla Location: [address] Dayroom   Group Topic: Communication, Team Building, Problem Solving  Goal Area(s) Addresses:  Patient will effectively work with peer towards shared goal.  Patient will identify skills used to make activity successful.  Patient will identify how skills used during activity can be applied to reach post d/c goals.   Behavioral Response:   Intervention: STEM Activity- Glass blower/designer  Activity: Tallest [employer]. In teams of 5-6, patients were given 11 craft pipe cleaners. Using the materials provided, patients were instructed to compete again the opposing team(s) to build the tallest free-standing structure from floor level. The activity was timed; difficulty increased by Clinical research associate as Production designer, theatre/television/film continued.  Systematically resources were removed with additional directions for example, placing one arm behind their back, working in silence, and shape stipulations. Francis Padilla facilitated post-activity discussion reviewing team processes and necessary communication skills involved in completion. Patients were encouraged to reflect how the skills utilized, or not utilized, in this activity can be incorporated to positively impact support systems post discharge.  Education: Pharmacist, community, Scientist, physiological, Discharge Planning   Education Outcome: Acknowledges education/In group clarification offered/Needs additional education.    Affect/Mood: N/A   Participation Level: Did not attend    Clinical Observations/Individualized Feedback:     Plan: Continue to engage patient in RT group sessions 2-3x/week.   Francis Padilla, Francis Padilla,Francis Padilla 05/20/2024 11:32 AM

## 2024-05-20 NOTE — [facility] Group Notes (Signed)
 Spirituality Group   Group Goal: Support / Education around grief and loss    Group Description: Following introductions and group rules, group members engaged in facilitated group dialog and support around topic of loss, with particular support around experiences of loss in their lives. Group members identified types of loss (relationships / self / things) as well as patterns, circumstances, and changes that precipitate loss. Reflection invited on thoughts / feelings around loss, normalized grief responses, and recognized variety in grief experience. Group noted Worden's four tasks of grief in discussion. Group drew on Adlerian / Rogerian, narrative, MI, with Yalom's group therapy as a primary framework.   Observations: Francis Padilla was an active participant in the group discussion.  Volanda Mangine L. Fredrica, M.Div [phone]

## 2024-05-20 NOTE — Progress Notes (Signed)
   05/20/24 2045  Psych Admission Type (Psych Patients Only)  Admission Status Involuntary  Psychosocial Assessment  Patient Complaints Anxiety  Eye Contact Glaring  Facial Expression Anxious  Affect Appropriate to circumstance  Speech Logical/coherent  Interaction Cautious;Guarded  Motor Activity Slow  Appearance/Hygiene Unremarkable  Behavior Characteristics Appropriate to situation  Mood Anxious  Aggressive Behavior  Effect No apparent injury  Thought Process  Coherency WDL  Content WDL  Delusions WDL  Perception WDL  Hallucination None reported or observed  Judgment Impaired  Confusion WDL  Danger to Self  Current suicidal ideation? Denies  Danger to Others  Danger to Others None reported or observed

## 2024-05-20 NOTE — Plan of Care (Signed)
   Problem: Education: Goal: Emotional status will improve Outcome: Progressing Goal: Mental status will improve Outcome: Progressing   Problem: Activity: Goal: Interest or engagement in activities will improve Outcome: Progressing Goal: Sleeping patterns will improve Outcome: Progressing

## 2024-05-20 NOTE — Group Note (Signed)
 Occupational Therapy Group Note  Group Topic:Coping Skills  Group Date: 05/20/2024 Start Time: 1430 End Time: 1500 Facilitators: Dot [place] MATSU, OT   Group Description: Group encouraged increased engagement and participation through discussion and activity focused on Coping Ahead. Patients were split up into teams and selected a card from a stack of positive coping strategies. Patients were instructed to act out/charade the coping skill for other peers to guess and receive points for their team. Discussion followed with a focus on identifying additional positive coping strategies and patients shared how they were going to cope ahead over the weekend while continuing hospitalization stay.  Therapeutic Goal(s): Identify positive vs negative coping strategies. Identify coping skills to be used during hospitalization vs coping skills outside of hospital/at home Increase participation in therapeutic group environment and promote engagement in treatment   Participation Level: Engaged   Participation Quality: Independent   Behavior: Appropriate   Speech/Thought Process: Relevant   Affect/Mood: Appropriate   Insight: Fair   Judgement: Fair      Modes of Intervention: Education  Patient Response to Interventions:  Attentive   Plan: Continue to engage patient in OT groups 2 - 3x/week.  05/20/2024  [place] MATSU Dot, OT   Geral Coker, OT

## 2024-05-20 NOTE — BH IP Treatment Plan (Signed)
 Interdisciplinary Treatment and Diagnostic Plan Update  05/20/2024 Time of Session: 11:20AM Francis Padilla MRN: [id number]  Principal Diagnosis: MDD (major depressive disorder), recurrent severe, without psychosis (HCC)  Secondary Diagnoses: Principal Problem:   MDD (major depressive disorder), recurrent severe, without psychosis (HCC) Active Problems:   Alcohol dependence (HCC)   GAD (generalized anxiety disorder)   Current Medications:  Current Facility-Administered Medications  Medication Dose Route Frequency Provider Last Rate Last Admin   acetaminophen  (TYLENOL ) tablet 650 mg  650 mg Oral Q6H PRN Bobbitt, Shalon E, NP       alum & mag hydroxide-simeth (MAALOX/MYLANTA) 200-200-20 MG/5ML suspension 30 mL  30 mL Oral Q4H PRN Bobbitt, Shalon E, NP       haloperidol  (HALDOL ) tablet 5 mg  5 mg Oral TID PRN Bobbitt, Shalon E, NP       And   diphenhydrAMINE  (BENADRYL ) capsule 50 mg  50 mg Oral TID PRN Bobbitt, Shalon E, NP       haloperidol  lactate (HALDOL ) injection 5 mg  5 mg Intramuscular TID PRN Bobbitt, Shalon E, NP       And   diphenhydrAMINE  (BENADRYL ) injection 50 mg  50 mg Intramuscular TID PRN Bobbitt, Shalon E, NP       And   LORazepam  (ATIVAN ) injection 2 mg  2 mg Intramuscular TID PRN Bobbitt, Shalon E, NP       haloperidol  lactate (HALDOL ) injection 10 mg  10 mg Intramuscular TID PRN Bobbitt, Shalon E, NP       And   diphenhydrAMINE  (BENADRYL ) injection 50 mg  50 mg Intramuscular TID PRN Bobbitt, Shalon E, NP       And   LORazepam  (ATIVAN ) injection 2 mg  2 mg Intramuscular TID PRN Bobbitt, Shalon E, NP       hydrOXYzine  (ATARAX ) tablet 25 mg  25 mg Oral TID PRN Bobbitt, Shalon E, NP       hydrOXYzine  (ATARAX ) tablet 25 mg  25 mg Oral Q6H PRN Bobbitt, Shalon E, NP   25 mg at 05/19/24 2108   loperamide  (IMODIUM ) capsule 2-4 mg  2-4 mg Oral PRN Bobbitt, Shalon E, NP       LORazepam  (ATIVAN ) tablet 1 mg  1 mg Oral Q6H PRN Bobbitt, Shalon E, NP   1 mg at 05/19/24 1730    LORazepam  (ATIVAN ) tablet 1 mg  1 mg Oral TID Bobbitt, Shalon E, NP   1 mg at 05/20/24 1142   Followed by   NOREEN ON 05/21/2024] LORazepam  (ATIVAN ) tablet 1 mg  1 mg Oral BID Bobbitt, Shalon E, NP       Followed by   NOREEN ON 05/23/2024] LORazepam  (ATIVAN ) tablet 1 mg  1 mg Oral Daily Bobbitt, Shalon E, NP       magnesium  hydroxide (MILK OF MAGNESIA) suspension 30 mL  30 mL Oral Daily PRN Bobbitt, Shalon E, NP       multivitamin with minerals tablet 1 tablet  1 tablet Oral Daily Bobbitt, Shalon E, NP   1 tablet at 05/20/24 0816   nicotine  polacrilex (NICORETTE ) gum 2 mg  2 mg Oral PRN Bobbitt, Shalon E, NP   2 mg at 05/20/24 1146   ondansetron  (ZOFRAN -ODT) disintegrating tablet 4 mg  4 mg Oral Q6H PRN Bobbitt, Shalon E, NP       risperiDONE  (RISPERDAL ) tablet 0.5 mg  0.5 mg Oral BID Faunce, Alina, DO   0.5 mg at 05/20/24 0816   [START ON 05/21/2024] sertraline  (ZOLOFT ) tablet 50  mg  50 mg Oral Daily Faunce, Alina, DO       thiamine  (Vitamin B-1) tablet 100 mg  100 mg Oral Daily Bobbitt, Shalon E, NP   100 mg at 05/20/24 0816   thiamine  (VITAMIN B1) injection 100 mg  100 mg Intramuscular Once Bobbitt, Shalon E, NP       traZODone  (DESYREL ) tablet 50 mg  50 mg Oral QHS PRN Bobbitt, Shalon E, NP   50 mg at 05/19/24 2108   PTA Medications: No medications prior to admission.    Patient Stressors: Financial difficulties   Health problems   Loss of mother died last year, daughter in foster care   Marital or family conflict   Substance abuse   Traumatic event    Patient Strengths: Average or above average intelligence  Communication skills   Treatment Modalities: Medication Management, Group therapy, Case management,  1 to 1 session with clinician, Psychoeducation, Recreational therapy.   Physician Treatment Plan for Primary Diagnosis: MDD (major depressive disorder), recurrent severe, without psychosis (HCC) Long Term Goal(s): Improvement in symptoms so as ready for discharge   Short Term  Goals: Ability to verbalize feelings will improve Ability to disclose and discuss suicidal ideas Ability to identify triggers associated with substance abuse/mental health issues will improve  Medication Management: Evaluate patient's response, side effects, and tolerance of medication regimen.  Therapeutic Interventions: 1 to 1 sessions, Unit Group sessions and Medication administration.  Evaluation of Outcomes: Not Progressing  Physician Treatment Plan for Secondary Diagnosis: Principal Problem:   MDD (major depressive disorder), recurrent severe, without psychosis (HCC) Active Problems:   Alcohol dependence (HCC)   GAD (generalized anxiety disorder)  Long Term Goal(s): Improvement in symptoms so as ready for discharge   Short Term Goals: Ability to verbalize feelings will improve Ability to disclose and discuss suicidal ideas Ability to identify triggers associated with substance abuse/mental health issues will improve     Medication Management: Evaluate patient's response, side effects, and tolerance of medication regimen.  Therapeutic Interventions: 1 to 1 sessions, Unit Group sessions and Medication administration.  Evaluation of Outcomes: Not Progressing   RN Treatment Plan for Primary Diagnosis: MDD (major depressive disorder), recurrent severe, without psychosis (HCC) Long Term Goal(s): Knowledge of disease and therapeutic regimen to maintain health will improve  Short Term Goals: Ability to verbalize frustration and anger appropriately will improve, Ability to participate in decision making will improve, Ability to verbalize feelings will improve, Ability to disclose and discuss suicidal ideas, Ability to identify and develop effective coping behaviors will improve, and Compliance with prescribed medications will improve  Medication Management: RN will administer medications as ordered by provider, will assess and evaluate patient's response and provide education to  patient for prescribed medication. RN will report any adverse and/or side effects to prescribing provider.  Therapeutic Interventions: 1 on 1 counseling sessions, Psychoeducation, Medication administration, Evaluate responses to treatment, Monitor vital signs and CBGs as ordered, Perform/monitor CIWA, COWS, AIMS and Fall Risk screenings as ordered, Perform wound care treatments as ordered.  Evaluation of Outcomes: Not Progressing   LCSW Treatment Plan for Primary Diagnosis: MDD (major depressive disorder), recurrent severe, without psychosis (HCC) Long Term Goal(s): Safe transition to appropriate next level of care at discharge, Engage patient in therapeutic group addressing interpersonal concerns.  Short Term Goals: Engage patient in aftercare planning with referrals and resources, Increase social support, Increase ability to appropriately verbalize feelings, Facilitate acceptance of mental health diagnosis and concerns, Identify triggers associated with mental health/substance abuse  issues, and Increase skills for wellness and recovery  Therapeutic Interventions: Assess for all discharge needs, 1 to 1 time with Social worker, Explore available resources and support systems, Assess for adequacy in community support network, Educate family and significant other(s) on suicide prevention, Complete Psychosocial Assessment, Interpersonal group therapy.  Evaluation of Outcomes: Not Progressing   Progress in Treatment: Attending groups: Yes. Attending some groups Participating in groups: Yes. Taking medication as prescribed: Yes. Toleration medication: Yes. Family/Significant other contact made: No, will contact:  Pt did not give consent to contact family. Patient understands diagnosis: Yes. Discussing patient identified problems/goals with staff: Yes. Medical problems stabilized or resolved: Yes. Denies suicidal/homicidal ideation: Yes. Issues/concerns per patient self-inventory: No.  Patient  Goals: I want my meds  Discharge Plan or Barriers: Patient plans to discharge home once stable.  Reason for Continuation of Hospitalization: Anxiety Medication stabilization Withdrawal symptoms  Estimated Length of Stay:  5-7 days  Last 3 [place] Suicide Severity Risk Score: Flowsheet Row [facility]Current[facility]  C-SSRS RISK CATEGORY High Risk    Last PHQ 2/9 Scores:     No data to display          Scribe for Treatment Team: Aronda Burford M Dezmin Kittelson, ISRA[place] 05/20/2024 1:02 PM

## 2024-05-20 NOTE — Plan of Care (Signed)
  Problem: Education: Goal: Knowledge of [facility] Education information/materials will improve Outcome: Progressing Goal: Emotional status will improve Outcome: Progressing Goal: Mental status will improve Outcome: Progressing Goal: Verbalization of understanding the information provided will improve Outcome: Progressing   Problem: Activity: Goal: Sleeping patterns will improve Outcome: Progressing   Problem: Coping: Goal: Ability to verbalize frustrations and anger appropriately will improve Outcome: Progressing Goal: Ability to demonstrate self-control will improve Outcome: Progressing   Problem: Health Behavior/Discharge Planning: Goal: Identification of resources available to assist in meeting health care needs will improve Outcome: Progressing   Problem: Physical Regulation: Goal: Ability to maintain clinical measurements within normal limits will improve Outcome: Progressing   Problem: Education: Goal: Knowledge of the prescribed therapeutic regimen will improve Outcome: Progressing   Problem: Activity: Goal: Interest or engagement in leisure activities will improve Outcome: Progressing Goal: Imbalance in normal sleep/wake cycle will improve Outcome: Progressing   Problem: Coping: Goal: Will verbalize feelings Outcome: Progressing   Problem: Health Behavior/Discharge Planning: Goal: Ability to make decisions will improve Outcome: Progressing   Problem: Role Relationship: Goal: Will demonstrate positive changes in social behaviors and relationships Outcome: Progressing   Problem: Safety: Goal: Ability to disclose and discuss suicidal ideas will improve Outcome: Progressing Goal: Ability to identify and utilize support systems that promote safety will improve Outcome: Progressing   Problem: Self-Concept: Goal: Will verbalize positive feelings about self Outcome: Progressing

## 2024-05-20 NOTE — Group Note (Signed)
 Date:  05/20/2024 Time:  9:32 AM  Group Topic/Focus:  Goals Group:   The focus of this group is to help patients establish daily goals to achieve during treatment and discuss how the patient can incorporate goal setting into their daily lives to aide in recovery.    Participation Level:  Did Not Attend  Participation Quality:    Affect:    Cognitive:    Insight:   Engagement in Group:    Modes of Intervention:    Additional Comments:  Pt did not attend group.  Francis Padilla Dawn 05/20/2024, 9:32 AM

## 2024-05-20 NOTE — Progress Notes (Signed)
 CSW spoke with patient who stated he was very anxious. Stated he took his medication for anxiety during med call and was feeling fine afterwards but after a while began to feel extremely anxious. Informed pt I would inform his nurse of this. CSW spoke with patient's nurse who confirmed he took medication for his anxiety and stated he'd receive another dose at 12 noon. CSW passed message along to patient.   Zienna Ahlin, LCSWA 05/20/2024 10:20am

## 2024-05-20 NOTE — Progress Notes (Signed)
   05/20/24 0800  Psych Admission Type (Psych Patients Only)  Admission Status Involuntary  Psychosocial Assessment  Patient Complaints Anxiety  Eye Contact Fair  Facial Expression Anxious  Affect Appropriate to circumstance  Speech Logical/coherent  Interaction Guarded  Motor Activity Other (Comment) (WDL)  Appearance/Hygiene In scrubs  Behavior Characteristics Appropriate to situation  Mood Anxious  Thought Process  Coherency WDL  Content WDL  Delusions None reported or observed  Perception WDL  Hallucination None reported or observed  Judgment Impaired  Confusion None  Danger to Self  Current suicidal ideation? Passive  Agreement Not to Harm Self No  Description of Agreement Verbal contracts for safety.  Danger to Others  Danger to Others None reported or observed

## 2024-05-20 NOTE — [facility] Group Notes (Signed)
The focus of this group is to help patients review their daily goal of treatment and discuss progress on daily workbooks. Pt was attentive and appropriate during tonight's wrap up group with aa/na.  

## 2024-05-21 MED ORDER — TRAZODONE HCL 100 MG PO TABS
100.0000 mg | ORAL_TABLET | Freq: Every evening | ORAL | Status: DC | PRN
  Administered 2024-05-21 – 2024-05-23 (×3): 100 mg via ORAL
  Filled 2024-05-21 (×3): qty 1

## 2024-05-21 NOTE — Progress Notes (Signed)
   05/21/24 0800  Psych Admission Type (Psych Patients Only)  Admission Status Involuntary  Psychosocial Assessment  Patient Complaints Anxiety  Eye Contact Glaring  Facial Expression Anxious  Affect Appropriate to circumstance  Speech Logical/coherent  Interaction Cautious;Guarded  Motor Activity Slow  Appearance/Hygiene Unremarkable  Behavior Characteristics Appropriate to situation;Cooperative  Mood Anxious  Aggressive Behavior  Effect No apparent injury  Thought Process  Coherency WDL  Content WDL  Delusions WDL  Perception WDL  Hallucination None reported or observed  Judgment WDL  Confusion WDL  Danger to Self  Current suicidal ideation? Denies  Agreement Not to Harm Self No  Description of Agreement Verbal contracts for safety.  Danger to Others  Danger to Others None reported or observed

## 2024-05-21 NOTE — Plan of Care (Signed)
  Problem: Coping: Goal: Ability to verbalize frustrations and anger appropriately will improve Outcome: Progressing   Problem: Physical Regulation: Goal: Ability to maintain clinical measurements within normal limits will improve Outcome: Progressing

## 2024-05-21 NOTE — Group Note (Signed)
 Recreation Therapy Group Note   Group Topic:Animal Assisted Therapy   Group Date: 05/21/2024 Start Time: 9051 End Time: 1030 Facilitators: Azaiah Mello-McCall, LRT,CTRS Location: [address] Hall Dayroom   Animal-Assisted Activity (AAA) Program Checklist/Progress Notes Patient Eligibility Criteria Checklist & Daily Group note for Rec Tx Intervention  AAA/T Program Assumption of Risk Form signed by Patient/ or Parent Legal Guardian Yes  Patient is free of allergies or severe asthma Yes  Patient reports no fear of animals Yes  Patient reports no history of cruelty to animals Yes  Patient understands his/her participation is voluntary Yes  Patient washes hands before animal contact Yes  Patient washes hands after animal contact Yes  Behavioral Response: Engaged   Education: Charity fundraiser, Appropriate Animal Interaction   Education Outcome: Acknowledges education.    Affect/Mood: Appropriate   Participation Level: Engaged   Participation Quality: Independent   Behavior: Appropriate   Speech/Thought Process: Focused   Insight: Good   Judgement: Good   Modes of Intervention: Teaching laboratory technician   Patient Response to Interventions:  Engaged   Education Outcome:  In group clarification offered    Clinical Observations/Individualized Feedback: Patient attended session and interacted appropriately with therapy dog and peers. Patient asked appropriate questions about therapy dog and his training. Patient shared stories about their pets at home with group.    Plan: Continue to engage patient in RT group sessions 2-3x/week.   Keiyon Plack-McCall, LRT,CTRS 05/21/2024 12:26 PM

## 2024-05-21 NOTE — Group Note (Signed)
 Date:  05/21/2024 Time:  9:52 AM  Group Topic/Focus:  Goals Group:   The focus of this group is to help patients establish daily goals to achieve during treatment and discuss how the patient can incorporate goal setting into their daily lives to aide in recovery. Orientation:   The focus of this group is to educate the patient on the purpose and policies of crisis stabilization and provide a format to answer questions about their admission.  The group details unit policies and expectations of patients while admitted.    Participation Level:  Active  Participation Quality:  Appropriate  Affect:  Appropriate  Cognitive:  Appropriate  Insight: Appropriate  Engagement in Group:  Engaged  Modes of Intervention:  Orientation  Additional Comments:  Goal is to be level headed  Francis Padilla 05/21/2024, 9:52 AM

## 2024-05-21 NOTE — Progress Notes (Signed)
 [facility] MD Progress Note 05/21/2024 7:46 AM Francis Padilla  MRN:  [id number] Principal Problem: MDD (major depressive disorder), recurrent severe, without psychosis (HCC) Diagnosis: Principal Problem:   MDD (major depressive disorder), recurrent severe, without psychosis (HCC) Active Problems:   Alcohol dependence (HCC)   GAD (generalized anxiety disorder)   Francis Padilla is a 28 y.o. M who presented on 7/6 to [facility] under IVC from [place] admitted for SI.   On interview today patient reports he slept fair last night. He siad he woke up at 2am and thought it was time to wake up and took a shower and made his bed. He said the nurse then told him it was 2:30am and he was able to fall back alseep. He thinks the trazodone  hasn't been working as well. He reports his appetite is doing good.  He reports no SI, HI, or AVH.  He reports no Paranoia or Ideas of Reference.  He reports no issues with his medications.  Today Francis Padilla is much more pleasant on interview. Francis Padilla's mood has been much more stable today and he is less agitated. He denies any withdrawal side effects except for some sweating last night. He says he has been tolerating the increase in Zoloft  and has been feeling less depressed. Tomorrow is the last day of his Ativan  taper and is feeling stressed about it, but denies any other symptoms of depression or anxiety. He has no other concerns at this time.  Physical Examination:  Musculoskeletal: Normal gait and station  Mental Status Exam: General Appearance: Casual  Attitude:  Other:  polite  Attention  Good  Volume:  Normal  Mood:  ok   Affect:  Congruent and Full Range  Thought Process:  Coherent and Linear  Thought Content:  WDL and Logical  Suicidal Thoughts:  No  Homicidal Thoughts:  No  Judgement:  Fair  Insight:  Fair  Psychomotor Activity:  Normal  Fund of Knowledge: WNL   Review of Systems  Constitutional:  Positive for diaphoresis.  Gastrointestinal:  Negative for  abdominal pain and nausea.  Neurological:  Negative for dizziness and headaches.  Psychiatric/Behavioral:  Negative for hallucinations and suicidal ideas. The patient is nervous/anxious.     Assessment and Plan: Francis Padilla is a 28 year old male who presented to [facility] on 7/6 under IVC from [place] for SI.   Today we will increase his trazodone . He said he has not noticed any side effects from Zoloft  or Risperdal  and his anxiety and depression have been doing better. The last day of Ativan  taper is tomorrow and we can expect to discharge on Thursday. Tomorrow we can discuss with [name] if he would be interested in starting Metformin to curb antipsychotic induced weight gain. No other medication changes at this time.    # Alcohol use disorder, MDD:  -Continue Risperdal  0.5mg  BID -Continue Zoloft  to 50mg  in the morning -Scheduled Ativan  taper (complete Wednesday) -discuss residential rehabilitation treatment program with social work -Continue CIWA 7/8 @ 0600 = 2 -Continue Ativan  1 mg q6 PRN CIWA>10 -Continue Imodium  2-4 mg PRN diarrhea -Continue Zofran -ODT 4 mg q6 PRN nausea -Continue Multivitamin daily for nutritional supplementation  -increase trazodone  to 100mg   # Tobacco Use Disorder  - Continue nicorette  gum 2mg  PRN - Smoking cessation encouraged.  Risk Assessment: Patient continues to be gravely disabled and unable to care for self due to severe and impairing MDD.   Discharge Planning: Barriers to Discharge: agitation and monitoring withdrawal Estimated Length of Stay: 1-2  days Predicted Discharge Location: home  Treatment Summary: -Continue Risperdal  0.5mg  BID -Increase Zoloft  to 50mg  in the morning -Scheduled Ativan  taper (complete Wednesday) -discuss residential rehabilitation treatment program with social work -Continue CIWA 7/7 @ 0600 = 2 -Continue Ativan  1 mg q6 PRN CIWA>10 -Continue Imodium  2-4 mg PRN diarrhea -Continue Zofran -ODT 4 mg q6 PRN nausea -Continue  Multivitamin daily for nutritional supplementation -Continue nicorette  gum 2mg  PRN   Francis Light, DO PGY-1, Psychiatry Residency  05/21/2024, 7:46 AM

## 2024-05-21 NOTE — Plan of Care (Signed)
  Problem: Education: Goal: Emotional status will improve Outcome: Progressing Goal: Mental status will improve Outcome: Progressing   Problem: Activity: Goal: Interest or engagement in activities will improve Outcome: Progressing Goal: Sleeping patterns will improve Outcome: Progressing   Problem: Physical Regulation: Goal: Ability to maintain clinical measurements within normal limits will improve Outcome: Progressing

## 2024-05-21 NOTE — Group Note (Signed)
 Date:  05/21/2024 Time:  9:27 PM  Group Topic/Focus:  Wrap-Up Group:   The focus of this group is to help patients review their daily goal of treatment and discuss progress on daily workbooks.    Participation Level:  Active  Participation Quality:  Appropriate and Attentive  Affect:  Appropriate  Cognitive:  Alert and Appropriate  Insight: Appropriate and Good  Engagement in Group:  Engaged  Modes of Intervention:  Discussion and Education  Additional Comments:  Pt attended and participated in wrap up group this evening and rated their day an 8/10. Pt stated that they had a great day because they woke up today, and that this was the best day they have had in a long tome. Pt reports that they felt less foggy. Pt goal is to no longer need to take Ativan  and to continue with their medications and therapy once they are D/C. Pt has not concerns to relay at this time.   DAWAUN BRANCATO 05/21/2024, 9:27 PM

## 2024-05-21 NOTE — Group Note (Signed)
 LCSW Group Therapy Note   Group Date: 05/21/2024 Start Time: 1100 End Time: 1200   Participation: patient was present  Type of Therapy:  Group Therapy  Topic:  Finding Balance: Using Wise Mind for Thoughtful Decisions  Objective:  the objective of this class is to help participants understand the concept of Wise Mind and learn how to apply it to real-life situations to make balanced, thoughtful decisions. Participants will gain tools to manage emotions, consider logic, and find a middle ground that leads to healthier responses and outcomes.  Goals: Understand the concept of Wise Mind.  Participants will learn the difference between Emotional Mind, Reasonable Mind, and Delsie Mind, and how Delsie Mind helps in balancing emotions and logic to make thoughtful decisions. Recognize when you're in Emotional Mind or Reasonable Mind.  Participants will identify the signs of Emotional Mind and Reasonable Mind in their own reactions to situations and understand how to move into Wise Mind for more balanced responses. Practice applying Delsie Mind to real-life situations.  Through scenarios and group activities, participants will practice using Wise Mind in everyday situations, learning how to acknowledge their emotions, think logically, and create solutions that are thoughtful and balanced.  Summary:  In this class, we explored the concept of Wise Mind - the balance between Emotional Mind and Reasonable Mind. We discussed how Emotional Mind can sometimes lead to impulsive, reactive decisions driven by intense feelings, and how Reasonable Mind might ignore feelings altogether, focusing only on facts and logic. Delsie Mind is the middle ground that combines both, allowing you to consider your emotions and use logic to make balanced, thoughtful decisions.  We learned how to recognize when we're in Emotional Mind or Reasonable Mind, and practiced using Wise Mind in real-life situations. By using Delsie Smiles, we can  improve how we handle challenging situations, make better decisions, and strengthen our relationships with others.   Shaden Higley O Jakye Mullens, LCSWA 05/21/2024  4:05 PM

## 2024-05-21 NOTE — Progress Notes (Signed)
   05/21/24 2015  Psych Admission Type (Psych Patients Only)  Admission Status Involuntary  Psychosocial Assessment  Patient Complaints Anxiety  Eye Contact Glaring  Facial Expression Anxious  Affect Appropriate to circumstance  Speech Logical/coherent  Interaction Cautious;Guarded  Motor Activity Slow  Appearance/Hygiene Unremarkable  Behavior Characteristics Appropriate to situation  Mood Anxious  Aggressive Behavior  Effect No apparent injury  Thought Process  Coherency WDL  Content WDL  Delusions WDL  Perception WDL  Hallucination None reported or observed  Judgment Impaired  Confusion WDL  Danger to Self  Current suicidal ideation? Denies  Danger to Others  Danger to Others None reported or observed

## 2024-05-22 DIAGNOSIS — F1029 Alcohol dependence with unspecified alcohol-induced disorder: Secondary | ICD-10-CM | POA: Diagnosis not present

## 2024-05-22 DIAGNOSIS — F332 Major depressive disorder, recurrent severe without psychotic features: Secondary | ICD-10-CM

## 2024-05-22 MED ORDER — SERTRALINE HCL 100 MG PO TABS
100.0000 mg | ORAL_TABLET | Freq: Every day | ORAL | Status: DC
  Administered 2024-05-23 – 2024-05-24 (×2): 100 mg via ORAL
  Filled 2024-05-22 (×2): qty 1

## 2024-05-22 NOTE — Plan of Care (Signed)
   Problem: Education: Goal: Emotional status will improve Outcome: Progressing Goal: Mental status will improve Outcome: Progressing Goal: Verbalization of understanding the information provided will improve Outcome: Progressing

## 2024-05-22 NOTE — [facility] Group Notes (Signed)
 [facility] Group Notes:  (Nursing/MHT/Case Management/Adjunct)  Date:  05/22/2024  Time:  2000  Type of Therapy:  Narcotics Anonymous Meeting  Participation Level:  Active  Participation Quality:  Appropriate, Attentive, and Supportive  Affect:  Appropriate  Cognitive:  Alert  Insight:  Improving  Engagement in Group:  Engaged  Modes of Intervention:  Clarification, Education, and Support  Summary of Progress/Problems:   Lenora Manuelita RAMAN 05/22/2024, 9:24 PM

## 2024-05-22 NOTE — Progress Notes (Signed)
 [facility] Inpatient Psychiatry Progress Note  Date: @TODAY @ Patient: Francis Padilla MRN: [id number]  Assessment and Plan: Francis Padilla is a 28 y.o. male admitted for SI on 7/6.  We will discontinue Risperdal  as he is no longer extremely agitated. Tomorrow we can consider adding Buspar for anxiety but would like to see how his anxiety is throughout the day off the Ativan . We will increase his Zoloft  today. Last dose of Ativan  was 0800 and will continue to monitor his vitals for the next 24h. No other changes at this time.   # MDD (major depressive disorder), recurrent severe, without psychosis (HCC) #Alcohol Use Disorder #GAD -Discontinue Risperdal  0.5mg  BID  -Increase Zoloft  to 100mg  in the morning -Scheduled Ativan  taper (complete Wednesday) -discuss residential rehabilitation treatment program with social work -Continue CIWA 7/8 @ 1741 = 0 -Continue Ativan  1 mg q6 PRN CIWA>10 -Continue Imodium  2-4 mg PRN diarrhea -Continue Zofran -ODT 4 mg q6 PRN nausea -Continue Multivitamin daily for nutritional supplementation -Continue trazodone  100mg  at night  # Tobacco Use Disorder  - Continue nicorette  gum 2mg  PRN - Smoking cessation encouraged.  Risk Assessment - Patient continues to be considered mild risk d/t previous suicidal behaviors but no acute concerns at this time.  Justification for Hospitalization: Dionte had his last dose of the Ativan  taper at 0800. His blood pressure continues to be slightly elevated so we will monitor vitals the last 24h of withdrawals. His last CIWA score on 7/8 at 1741 was 0. He says he is not experiencing any withdrawal symptoms other than some mild tremors.  Discharge Planning Barriers to discharge: unstable vitals and anxiety Estimated length of stay: 2-3 days Predicted Discharge location: SA-IOP   Interval History: 7/9: Today on interview Francis Padilla is pleasant. Today is the last day of his Ativan  taper and we will monitor  his vitals throughout the day and tomorrow. He says he is not experiencing any withdrawal symptoms other than some mild tremors. We discussed where he is going to be going after inpatient psychiatric care. He says he is unsure about AA or a 28 day program. He says he has tried some of that before in the past and it didn't help. He reported he wants to try therapy, a SA-IOP program could be helpful for him. We will discuss his past successes and failures with substance use programs to determine where he should be discharged.     Physical Exam MSK/Neuro - normal gait and station Mental Status Exam Appearance - casual Attitude - polite Speech - normal volume, prosody, inflection Mood - alright Affect - congruent, appropriate Thought Process - coherent, mildly disorganized Thought Content - WDL and logical SI/HI - no Perceptions - none Judgement/Insight - fair Fund of knowledge - WNL Language - No impairments      Lab Results:  Admission on 05/18/2024  Component Date Value Ref Range Status   Sodium 05/20/2024 137  135 - 145 mmol/L Final   Potassium 05/20/2024 4.0  3.5 - 5.1 mmol/L Final   Chloride 05/20/2024 105  98 - 111 mmol/L Final   CO2 05/20/2024 23  22 - 32 mmol/L Final   Glucose, Bld 05/20/2024 97  70 - 99 mg/dL Final   BUN 92/92/7974 12  6 - 20 mg/dL Final   Creatinine, Ser 05/20/2024 0.89  0.61 - 1.24 mg/dL Final   Calcium 92/92/7974 9.1  8.9 - 10.3 mg/dL Final   Total Protein 92/92/7974 7.6  6.5 - 8.1 g/dL Final   Albumin   05/20/2024 4.3  3.5 - 5.0 g/dL Final   AST 92/92/7974 24  15 - 41 U/L Final   ALT 05/20/2024 28  0 - 44 U/L Final   Alkaline Phosphatase 05/20/2024 59  38 - 126 U/L Final   Total Bilirubin 05/20/2024 1.0  0.0 - 1.2 mg/dL Final   GFR, Estimated 05/20/2024 >60  >60 mL/min Final   Anion gap 05/20/2024 9  5 - 15 Final   WBC 05/20/2024 8.6  4.0 - 10.5 K/uL Final   RBC 05/20/2024 5.06  4.22 - 5.81 MIL/uL Final   Hemoglobin 05/20/2024 16.0  13.0 - 17.0  g/dL Final   HCT 92/92/7974 48.2  39.0 - 52.0 % Final   MCV 05/20/2024 95.3  80.0 - 100.0 fL Final   MCH 05/20/2024 31.6  26.0 - 34.0 pg Final   MCHC 05/20/2024 33.2  30.0 - 36.0 g/dL Final   RDW 92/92/7974 12.4  11.5 - 15.5 % Final   Platelets 05/20/2024 233  150 - 400 K/uL Final   nRBC 05/20/2024 0.0  0.0 - 0.2 % Final   Neutrophils Relative % 05/20/2024 41  % Final   Neutro Abs 05/20/2024 3.6  1.7 - 7.7 K/uL Final   Lymphocytes Relative 05/20/2024 46  % Final   Lymphs Abs 05/20/2024 4.0  0.7 - 4.0 K/uL Final   Monocytes Relative 05/20/2024 10  % Final   Monocytes Absolute 05/20/2024 0.8  0.1 - 1.0 K/uL Final   Eosinophils Relative 05/20/2024 1  % Final   Eosinophils Absolute 05/20/2024 0.1  0.0 - 0.5 K/uL Final   Basophils Relative 05/20/2024 1  % Final   Basophils Absolute 05/20/2024 0.0  0.0 - 0.1 K/uL Final   Immature Granulocytes 05/20/2024 1  % Final   Abs Immature Granulocytes 05/20/2024 0.05  0.00 - 0.07 K/uL Final   Cholesterol 05/20/2024 195  0 - 200 mg/dL Final   Triglycerides 92/92/7974 74  <150 mg/dL Final   HDL 92/92/7974 69  >40 mg/dL Final   Total CHOL/HDL Ratio 05/20/2024 2.8  RATIO Final   VLDL 05/20/2024 15  0 - 40 mg/dL Final   LDL Cholesterol 05/20/2024 111 (H)  0 - 99 mg/dL Final   Hgb J8r MFr Bld 05/20/2024 6.2 (H)  4.8 - 5.6 % Final   Mean Plasma Glucose 05/20/2024 131.24  mg/dL Final   TSH 92/92/7974 2.972  0.350 - 4.500 uIU/mL Final     Vitals: Blood pressure (!) 136/92, pulse 89, temperature 98.3 F (36.8 C), temperature source Oral, resp. rate 18, height 5' 9.29 (1.76 m), weight 107.5 kg, SpO2 97%.    [name], DO

## 2024-05-22 NOTE — Group Note (Signed)
 Recreation Therapy Group Note   Group Topic:Leisure Education  Group Date: 05/22/2024 Start Time: 0932 End Time: 1040 Facilitators: Oma Marzan-McCall, LRT,CTRS Location: [address] Dayroom   Group Topic: Leisure Education  Goal Area(s) Addresses:  Patient will identify positive leisure programs for use post discharge. Patient will identify at least one positive benefit of participation in leisure activities.  Patient will work effectively work with peer by sharing ideas and contributing to Social worker.  Behavioral Response: Engaged   Intervention: Innovation, Group Presentation   Activity: In pairs, patients were asked to create a program they would implement in the community if given the chance. Groups were to create a name for the program, identify the audience the program is geared towards (ie: everybody, specific ages, different demographics, etc), where the program will be held (ie: community center, Engineering geologist, school, church, Catering manager) and identify the benefits of the program. Groups would then present their programs to the group.  Education:  Leisure Scientist, physiological, Special educational needs teacher, Teamwork, Discharge Planning  Education Outcome: Acknowledges education/In group clarification offered/Needs additional education.    Affect/Mood: Appropriate   Participation Level: Engaged   Participation Quality: Independent   Behavior: Appropriate   Speech/Thought Process: Focused   Insight: Good   Judgement: Good   Modes of Intervention: Group work   Patient Response to Interventions:  Engaged   Education Outcome:  In group clarification offered    Clinical Observations/Individualized Feedback: Pt was bright and engaged throughout group session. Pt worked great with peers in creating a church called [employer]. The program would provide a creative starting point for all ages in the community. The program would also offer drug treatment programs, food/clothes closet,  etc. Pt appeared fully focused on creating the program they thought of.    Plan: Continue to engage patient in RT group sessions 2-3x/week.   Jamee Keach-McCall, LRT,CTRS 05/22/2024 12:29 PM

## 2024-05-22 NOTE — Progress Notes (Signed)
   05/22/24 1100  Psych Admission Type (Psych Patients Only)  Admission Status Involuntary  Psychosocial Assessment  Patient Complaints Anxiety  Eye Contact Fair  Facial Expression Animated  Affect Appropriate to circumstance  Speech Logical/coherent  Interaction Assertive  Motor Activity Other (Comment) (wnl)  Appearance/Hygiene Unremarkable  Behavior Characteristics Appropriate to situation  Mood Anxious  Thought Process  Coherency WDL  Content WDL  Delusions None reported or observed  Perception WDL  Hallucination None reported or observed  Judgment WDL  Confusion WDL  Danger to Self  Current suicidal ideation? Denies

## 2024-05-22 NOTE — Progress Notes (Signed)
 Patient ID: Francis Padilla, male   DOB: [birth year], 28 y.o.   MRN: [id number] CSW spoke with Pt on the unit. He was appeared with pleasant affect and was prepare to engage in downstairs group activity. Confirmed with Pt his anticipated plans for services post discharge. When asked about engaging in substance abuse specific services post discharge, Pt states that he feels substance abuse is related to self-medicating and really feels he needs a therapist to discuss the issues that lead to use.   CSW team will continue to follow for care coordination and discharge planning needs.    Virl Coble N Zaia Carre, LCSW 05/22/24 4:17 PM

## 2024-05-23 MED ORDER — ALBUTEROL SULFATE HFA 108 (90 BASE) MCG/ACT IN AERS
1.0000 | INHALATION_SPRAY | Freq: Four times a day (QID) | RESPIRATORY_TRACT | Status: DC | PRN
  Administered 2024-05-23: 2 via RESPIRATORY_TRACT
  Filled 2024-05-23: qty 6.7

## 2024-05-23 MED ORDER — LORAZEPAM 1 MG PO TABS: 1.0000 mg | ORAL_TABLET | ORAL | Status: DC | PRN

## 2024-05-23 MED ORDER — MENTHOL 3 MG MT LOZG
LOZENGE | OROMUCOSAL | Status: AC
  Administered 2024-05-23: 3 mg via ORAL
  Filled 2024-05-23: qty 9

## 2024-05-23 MED ORDER — LORAZEPAM 0.5 MG PO TABS
0.5000 mg | ORAL_TABLET | Freq: Two times a day (BID) | ORAL | Status: DC
  Administered 2024-05-23 – 2024-05-24 (×2): 0.5 mg via ORAL
  Filled 2024-05-23 (×2): qty 1

## 2024-05-23 MED ORDER — LORAZEPAM 1 MG PO TABS
2.0000 mg | ORAL_TABLET | Freq: Once | ORAL | Status: AC
  Administered 2024-05-23: 2 mg via ORAL
  Filled 2024-05-23: qty 2

## 2024-05-23 MED ORDER — MENTHOL 3 MG MT LOZG: 1.0000 | LOZENGE | OROMUCOSAL | Status: DC | PRN

## 2024-05-23 MED ORDER — HYDRALAZINE HCL 25 MG PO TABS
25.0000 mg | ORAL_TABLET | Freq: Four times a day (QID) | ORAL | Status: DC | PRN
  Administered 2024-05-23: 25 mg via ORAL
  Filled 2024-05-23: qty 1
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
## 2024-05-23 NOTE — Plan of Care (Signed)

## 2024-05-23 NOTE — Group Note (Signed)
 LCSW Group Therapy Note   Group Date: 05/23/2024 Start Time: 1100 End Time: 1200   Participation:  patient was present  Type:  Group Therapy  Topic:  Money Matters: Ecologist, Confidence and Peace of Mind  Objective: To help participants understand the impact of financial stability on well-being through the lens of Maslow's Hierarchy of Needs and develop practical strategies for budgeting, saving, and debt repayment.  Goals: Increase awareness of spending habits and financial priorities, recognizing how money supports basic needs, security, and relationships. Develop simple budgeting and saving strategies to enhance stability and peace of mind.  Reduce financial stress by creating a realistic debt repayment plan, supporting long-term confidence and well-being.  Summary:  Participants explored how financial stability connects to basic needs, relationships, and self-esteem using Maslow's Hierarchy. They discussed budgeting, saving, and debt repayment strategies, identifying small, manageable changes. Through interactive discussion and self-reflection, they gained insight into their financial habits and created personal action steps for improvement.  Therapeutic Modalities Used: Elements of Cognitive Behavioral Therapy (CBT) - Addressing financial stress and thought patterns. Psychoeducation - Engineer, agricultural. Elements of Motivational Interviewing (MI) - Encouraging realistic, achievable changes. Group Support - Reducing shame and stress through shared experiences.   Johnathan Tortorelli O Matilynn Dacey, LCSWA 05/23/2024  12:39 PM

## 2024-05-23 NOTE — Progress Notes (Signed)
   05/22/24 2000  Psych Admission Type (Psych Patients Only)  Admission Status Involuntary  Psychosocial Assessment  Patient Complaints Anxiety  Eye Contact Fair  Facial Expression Animated  Affect Appropriate to circumstance  Speech Logical/coherent  Interaction Assertive  Motor Activity Other (Comment) (WNL)  Appearance/Hygiene Unremarkable  Behavior Characteristics Appropriate to situation  Mood Anxious  Thought Process  Coherency WDL  Content WDL  Delusions WDL  Perception WDL  Hallucination None reported or observed  Judgment WDL  Confusion WDL  Danger to Self  Current suicidal ideation? Denies

## 2024-05-23 NOTE — Plan of Care (Signed)
  Problem: Education: Goal: Knowledge of [place] [facility] Education information/materials will improve Outcome: Progressing Goal: Emotional status will improve Outcome: Progressing Goal: Mental status will improve Outcome: Progressing Goal: Verbalization of understanding the information provided will improve Outcome: Progressing   Problem: Activity: Goal: Interest or engagement in activities will improve Outcome: Progressing Goal: Sleeping patterns will improve Outcome: Progressing   Problem: Coping: Goal: Ability to verbalize frustrations and anger appropriately will improve Outcome: Progressing Goal: Ability to demonstrate self-control will improve Outcome: Progressing   Problem: Health Behavior/Discharge Planning: Goal: Identification of resources available to assist in meeting health care needs will improve Outcome: Progressing Goal: Compliance with treatment plan for underlying cause of condition will improve Outcome: Progressing   Problem: Physical Regulation: Goal: Ability to maintain clinical measurements within normal limits will improve Outcome: Progressing   Problem: Safety: Goal: Periods of time without injury will increase Outcome: Progressing   Problem: Education: Goal: Utilization of techniques to improve thought processes will improve Outcome: Progressing Goal: Knowledge of the prescribed therapeutic regimen will improve Outcome: Progressing   Problem: Activity: Goal: Interest or engagement in leisure activities will improve Outcome: Progressing Goal: Imbalance in normal sleep/wake cycle will improve Outcome: Progressing   Problem: Coping: Goal: Coping ability will improve Outcome: Progressing Goal: Will verbalize feelings Outcome: Progressing   Problem: Health Behavior/Discharge Planning: Goal: Ability to make decisions will improve Outcome: Progressing Goal: Compliance with therapeutic regimen will improve Outcome: Progressing    Problem: Role Relationship: Goal: Will demonstrate positive changes in social behaviors and relationships Outcome: Progressing   Problem: Safety: Goal: Ability to disclose and discuss suicidal ideas will improve Outcome: Progressing Goal: Ability to identify and utilize support systems that promote safety will improve Outcome: Progressing   Problem: Self-Concept: Goal: Will verbalize positive feelings about self Outcome: Progressing Goal: Level of anxiety will decrease Outcome: Progressing   Problem: Education: Goal: Ability to make informed decisions regarding treatment will improve Outcome: Progressing   Problem: Coping: Goal: Coping ability will improve Outcome: Progressing   Problem: Health Behavior/Discharge Planning: Goal: Identification of resources available to assist in meeting health care needs will improve Outcome: Progressing   Problem: Medication: Goal: Compliance with prescribed medication regimen will improve Outcome: Progressing   Problem: Self-Concept: Goal: Ability to disclose and discuss suicidal ideas will improve Outcome: Progressing Goal: Will verbalize positive feelings about self Outcome: Progressing Note: Patient is on track. Patient will maintain adherence    Problem: Education: Goal: Knowledge of disease or condition will improve Outcome: Progressing Goal: Understanding of discharge needs will improve Outcome: Progressing   Problem: Health Behavior/Discharge Planning: Goal: Ability to identify changes in lifestyle to reduce recurrence of condition will improve Outcome: Progressing Goal: Identification of resources available to assist in meeting health care needs will improve Outcome: Progressing   Problem: Physical Regulation: Goal: Complications related to the disease process, condition or treatment will be avoided or minimized Outcome: Progressing   Problem: Safety: Goal: Ability to remain free from injury will improve Outcome:  Progressing

## 2024-05-23 NOTE — Group Note (Signed)
 Occupational Therapy Group Note   Group Topic:Goal Setting  Group Date: 05/23/2024 Start Time: 1400 End Time: 1430 Facilitators: Dot [place] MATSU, OT   Group Description: Group encouraged engagement and participation through discussion focused on goal setting. Group members were introduced to goal-setting using the SMART Goal framework, identifying goals as Specific, Measureable, Acheivable, Relevant, and Time-Bound. Group members took time from group to create their own personal goal reflecting the SMART goal template and shared for review by peers and OT.    Therapeutic Goal(s):  Identify at least one goal that fits the SMART framework    Participation Level: Engaged   Participation Quality: Independent   Behavior: Appropriate   Speech/Thought Process: Relevant   Affect/Mood: Appropriate   Insight: Fair   Judgement: Fair      Modes of Intervention: Education  Patient Response to Interventions:  Attentive   Plan: Continue to engage patient in OT groups 2 - 3x/week.  05/23/2024  [place] MATSU Dot, OT   Rayland Hamed, OT

## 2024-05-23 NOTE — [facility] Group Notes (Signed)
 Spirituality Group   Focus of discussion: Gratitude and Strength Awareness   Process: Following theoretical framework of group therapy of Irvin Yalom and further informed by Rogerian and Relational Cultural Theory approaches, participants invited to name:   Sources of gratitude (internal>external)   Articulate gratitude for self   Name a personal strength/gift/skill   Locate points of resonance among group members/engage the here and now   Conclude with grounding/breathwork    Observations: Francis Padilla seemed somewhat more outgoing and engaged since 7/7. He made several constructive comments and engaged in group discussion, responding to peers.  Derionna Salvador L. Fredrica, M.Div [phone]

## 2024-05-23 NOTE — Progress Notes (Signed)
 Pt approached nursing station with audible wheezing and a cough. Pt stated he has had a persistent cough for a while and believe it was related to smoking.Pt denied an dx of asthma, but stated he had been prescribed prn albuterol  for wheezing in the pass. On called provider notified and prn albuterol , and Cepacol administered. Safety checks continue q15.

## 2024-05-23 NOTE — [facility] Group Notes (Signed)
 [facility] Group Notes:  (Nursing/MHT/Case Management/Adjunct)  Date:  05/23/2024  Time:  2000  Type of Therapy:  Wrap up group  Participation Level:  Active  Participation Quality:  Appropriate, Attentive, Sharing, and Supportive  Affect:  Appropriate  Cognitive:  Alert  Insight:  Improving  Engagement in Group:  Engaged  Modes of Intervention:  Clarification, Education, and Support  Summary of Progress/Problems: Positive thinking and self-care were discussed.   Lenora Manuelita RAMAN 05/23/2024, 8:48 PM

## 2024-05-23 NOTE — Progress Notes (Signed)
 D: Patient is alert, oriented, pleasant, and cooperative. Denies SI, HI, AVH, and verbally contracts for safety. Patient reports he slept poor last night with sleeping medication. Patient reports his appetite as good, energy level as normal, and concentration as good. Patient rates his depression 1/10, hopelessness 1/10, and anxiety 1/10. Patient blood pressure elevated.    A: Scheduled medications administered per MD order. PRN hydroxyzine , hydralazine , and nicotine  gum administered. Support provided. Patient educated on safety on the unit and medications. Routine safety checks every 15 minutes. Patient stated understanding to tell nurse about any new physical symptoms. Patient understands to tell staff of any needs.     R: No adverse drug reactions noted. Patient verbally contracts for safety. Patient remains safe at this time and will continue to monitor.    05/23/24 1200  Psych Admission Type (Psych Patients Only)  Admission Status Involuntary  Psychosocial Assessment  Patient Complaints Anxiety  Eye Contact Fair  Facial Expression Animated;Anxious  Affect Appropriate to circumstance  Speech Logical/coherent  Interaction Assertive  Motor Activity Other (Comment) (WNL)  Appearance/Hygiene Unremarkable  Behavior Characteristics Appropriate to situation  Mood Anxious  Thought Process  Coherency WDL  Content WDL  Delusions None reported or observed  Perception WDL  Hallucination None reported or observed  Judgment Impaired  Confusion None  Danger to Self  Current suicidal ideation? Denies  Self-Injurious Behavior No self-injurious ideation or behavior indicators observed or expressed   Agreement Not to Harm Self Yes  Description of Agreement verbal  Danger to Others  Danger to Others None reported or observed

## 2024-05-24 MED ORDER — SERTRALINE HCL 100 MG PO TABS: 100.0000 mg | ORAL_TABLET | Freq: Every day | ORAL | 0 refills | Status: AC

## 2024-05-24 MED ORDER — TRAZODONE HCL 100 MG PO TABS: 100.0000 mg | ORAL_TABLET | Freq: Every evening | ORAL | 0 refills | Status: AC | PRN
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
## 2024-05-24 NOTE — Plan of Care (Signed)
  Problem: Education: Goal: Knowledge of [facility] Education information/materials will improve Outcome: Progressing Goal: Emotional status will improve Outcome: Progressing Goal: Mental status will improve Outcome: Progressing Goal: Verbalization of understanding the information provided will improve Outcome: Progressing   Problem: Activity: Goal: Interest or engagement in activities will improve Outcome: Progressing Goal: Sleeping patterns will improve Outcome: Progressing   Problem: Coping: Goal: Ability to verbalize frustrations and anger appropriately will improve Outcome: Progressing Goal: Ability to demonstrate self-control will improve Outcome: Progressing   Problem: Health Behavior/Discharge Planning: Goal: Identification of resources available to assist in meeting health care needs will improve Outcome: Progressing Goal: Compliance with treatment plan for underlying cause of condition will improve Outcome: Progressing   Problem: Physical Regulation: Goal: Ability to maintain clinical measurements within normal limits will improve Outcome: Progressing   Problem: Safety: Goal: Periods of time without injury will increase Outcome: Progressing   Problem: Education: Goal: Utilization of techniques to improve thought processes will improve Outcome: Progressing Goal: Knowledge of the prescribed therapeutic regimen will improve Outcome: Progressing   Problem: Activity: Goal: Interest or engagement in leisure activities will improve Outcome: Progressing Goal: Imbalance in normal sleep/wake cycle will improve Outcome: Progressing   Problem: Coping: Goal: Coping ability will improve Outcome: Progressing Goal: Will verbalize feelings Outcome: Progressing   Problem: Health Behavior/Discharge Planning: Goal: Ability to make decisions will improve Outcome: Progressing Goal: Compliance with therapeutic regimen will improve Outcome: Progressing    Problem: Role Relationship: Goal: Will demonstrate positive changes in social behaviors and relationships Outcome: Progressing   Problem: Safety: Goal: Ability to disclose and discuss suicidal ideas will improve Outcome: Progressing Goal: Ability to identify and utilize support systems that promote safety will improve Outcome: Progressing   Problem: Self-Concept: Goal: Will verbalize positive feelings about self Outcome: Progressing Goal: Level of anxiety will decrease Outcome: Progressing   Problem: Education: Goal: Ability to make informed decisions regarding treatment will improve Outcome: Progressing   Problem: Coping: Goal: Coping ability will improve Outcome: Progressing   Problem: Health Behavior/Discharge Planning: Goal: Identification of resources available to assist in meeting health care needs will improve Outcome: Progressing   Problem: Medication: Goal: Compliance with prescribed medication regimen will improve Outcome: Progressing   Problem: Self-Concept: Goal: Ability to disclose and discuss suicidal ideas will improve Outcome: Progressing Goal: Will verbalize positive feelings about self Outcome: Progressing Note: Patient is on track. Patient will maintain adherence    Problem: Education: Goal: Knowledge of disease or condition will improve Outcome: Progressing Goal: Understanding of discharge needs will improve Outcome: Progressing   Problem: Health Behavior/Discharge Planning: Goal: Ability to identify changes in lifestyle to reduce recurrence of condition will improve Outcome: Progressing Goal: Identification of resources available to assist in meeting health care needs will improve Outcome: Progressing   Problem: Physical Regulation: Goal: Complications related to the disease process, condition or treatment will be avoided or minimized Outcome: Progressing   Problem: Safety: Goal: Ability to remain free from injury will improve Outcome:  Progressing

## 2024-05-24 NOTE — Progress Notes (Signed)
 Patient is discharging at this time. Patient is A&Ox4. Stable. Patient denies SI,HI, and A/V/H with no plan/intent. Printed AVS reviewed with and given to patient along with medications and follow up appointments. Suicide safety plan and survey refused by patient. Patient verbalized all understanding. All valuables/belongings returned to patient. Patient is being transported by his sister. Patient denies any pain/discomfort. No s/s of current distress.

## 2024-05-24 NOTE — Progress Notes (Signed)
  [facility] Adult Case Management Discharge Plan :  Will you be returning to the same living situation after discharge:  Yes,  pt will be returning home after discharge.  At discharge, do you have transportation home?: Yes,  pt will be transported home by his sister.  Do you have the ability to pay for your medications: Yes,  pt reported having the ability to afford medications.   Release of information consent forms completed and in the chart;  Patient's signature needed at discharge.  Patient to Follow up at:  [facility] Go on 05/28/2024.   Why: Please go to this provider on 05/28/24 at 8:30 am for an assessment, to obtain therapy and medication management services.  They are open 24/7. Contact information: [address] [place] [address] [phone]                 Next level of care provider has access to [facility] Link:no  Safety Planning and Suicide Prevention discussed: No.Completed with patient as he did not provide consent to contact family/friends for completion.      Has patient been referred to the Quitline?: Patient refused referral for treatment  Patient has been referred for addiction treatment: Patient refused referral for treatment.  Teresa Lemmerman M Arthur Speagle, LCSWA 05/24/2024, 10:20 AM

## 2024-05-24 NOTE — [facility] Suicide Risk Assessment (Signed)
 Suicide Risk Assessment  Discharge Assessment    [facility] Discharge Suicide Risk Assessment   Principal Problem: MDD (major depressive disorder), recurrent severe, without psychosis (HCC) Discharge Diagnoses: Principal Problem:   MDD (major depressive disorder), recurrent severe, without psychosis (HCC) Active Problems:   Alcohol dependence (HCC)   GAD (generalized anxiety disorder)   Total Time spent with patient: 20 minutes  Francis Padilla is a 28 yr old male who presented on 7/6 to [facility] under IVC from [place] for SI.   Hospital Course  During the patient's hospitalization, patient had extensive initial psychiatric evaluation, and follow-up psychiatric evaluations every day.  Psychiatric diagnoses provided upon initial assessment: Principal Problem:   MDD (major depressive disorder), recurrent severe, without psychosis (HCC) Active Problems:   Alcohol dependence (HCC)   GAD (generalized anxiety disorder)   Patient's psychiatric medications were adjusted on admission: zoloft , trazodone , risperdal   During the hospitalization, other adjustments were made to the patient's psychiatric medication regimen: zoloft , trazodone   Patient's care was discussed during the interdisciplinary team meeting every day during the hospitalization.  The patient is not having side effects to prescribed psychiatric medication.  Gradually, patient started adjusting to milieu. The patient was evaluated each day by a clinical provider to ascertain response to treatment. Improvement was noted by the patient's report of decreasing symptoms, improved sleep and appetite, affect, medication tolerance, behavior, and participation in unit programming.  Patient was asked each day to complete a self inventory noting mood, mental status, pain, new symptoms, anxiety and concerns.    Symptoms were reported as significantly decreased or resolved completely by discharge.   On day of discharge, the patient reports that  their mood is stable. The patient denied having suicidal thoughts for more than 48 hours prior to discharge.  Patient denies having homicidal thoughts.  Patient denies having auditory hallucinations.  Patient denies any visual hallucinations or other symptoms of psychosis. The patient was motivated to continue taking medication with a goal of continued improvement in mental health.   The patient reports their target psychiatric symptoms of anxiety, agitation and depression responded well to the psychiatric medications, and the patient reports overall benefit other psychiatric hospitalization. Supportive psychotherapy was provided to the patient. The patient also participated in regular group therapy while hospitalized. Coping skills, problem solving as well as relaxation therapies were also part of the unit programming.  Labs were reviewed with the patient, and abnormal results were discussed with the patient.  The patient is able to verbalize their individual safety plan to this provider.  # It is recommended to the patient to continue psychiatric medications as prescribed, after discharge from the hospital.    # It is recommended to the patient to follow up with your outpatient psychiatric provider and PCP.  # It was discussed with the patient, the impact of alcohol, drugs, tobacco have been there overall psychiatric and medical wellbeing, and total abstinence from substance use was recommended the patient.ed.  # Prescriptions provided or sent directly to preferred pharmacy at discharge. Patient agreeable to plan. Given opportunity to ask questions. Appears to feel comfortable with discharge.    # In the event of worsening symptoms, the patient is instructed to call the crisis hotline, 911 and or go to the nearest ED for appropriate evaluation and treatment of symptoms. To follow-up with primary care provider for other medical issues, concerns and or health care needs  # Patient was discharged  home with his sister with a plan to follow up as  noted below.    Musculoskeletal: Strength & Muscle Tone: within normal limits Gait & Station: normal Patient leans: N/A  Psychiatric Specialty Exam  Appearance: casual  Behavior: appropiate  Attitude: polite  Speech: normal volume and cadence  Mood: good  Affect: full  Thought Process: coherent and linear  Thought Content: WDL   SI/HI: no  Perceptions: none  Judgement: Fair  Insight: Fair  Fund of Knowledge: WNL    Physical Exam: Physical Exam Vitals and nursing note reviewed.  Constitutional:      General: He is not in acute distress.    Appearance: Normal appearance. He is normal weight. He is not ill-appearing or toxic-appearing.  Pulmonary:     Effort: Pulmonary effort is normal.  Musculoskeletal:        General: Normal range of motion.  Neurological:     General: No focal deficit present.     Mental Status: He is alert.    Review of Systems  Gastrointestinal:  Negative for nausea.  Neurological:  Negative for dizziness and headaches.  Psychiatric/Behavioral:  Negative for depression, hallucinations and suicidal ideas.    Blood pressure 138/88, pulse 75, temperature 98.2 F (36.8 C), temperature source Oral, resp. rate 18, height 5' 9.29 (1.76 m), weight 107.5 kg, SpO2 99%. Body mass index is 34.71 kg/m.  Mental Status Per Nursing Assessment::   On Admission:  Suicidal ideation indicated by patient, Self-harm thoughts, Self-harm behaviors  Demographic Factors:  Male, Caucasian, and Low socioeconomic status Loss Factors: Loss of significant relationship and Financial problems/change in socioeconomic status Historical Factors: Impulsivity Risk Reduction Factors:   Living with another person, especially a relative   Continued Clinical Symptoms:  Severe Anxiety and/or Agitation Alcohol/Substance Abuse/Dependencies More than one psychiatric diagnosis  Cognitive Features That Contribute To Risk:   None    Suicide Risk:  Mild: There are no identifiable suicide plans, no associated intent, mild dysphoria and related symptoms, good self-control (both objective and subjective assessment), few other risk factors, and identifiable protective factors, including available and accessible social support. or Minimal: No identifiable suicidal ideation.  Patients presenting with no risk factors but with morbid ruminations; may be classified as minimal risk based on the severity of the depressive symptoms   Follow-up Information     [facility]. Go on 05/28/2024.   Why: Please go to this provider on 05/28/24 at 8:30 am for an assessment, to obtain therapy and medication management services.  They are open 24/7. Contact information: [address] [place] [address] [phone]                 Plan Of Care/Follow-up recommendations:  Activity: as tolerated  Diet: heart healthy  Other: -Follow-up with your outpatient psychiatric provider -instructions on appointment date, time, and address (location) are provided to you in discharge paperwork.  -Take your psychiatric medications as prescribed at discharge - instructions are provided to you in the discharge paperwork  -Follow-up with outpatient primary care doctor and other specialists -for management of preventative medicine and chronic medical disease, including: nicotine  dependence  -Testing: Follow-up with outpatient provider for abnormal lab results:   -Recommend abstinence from alcohol, tobacco, and other illicit drug use at discharge.   -If your psychiatric symptoms recur, worsen, or if you have side effects to your psychiatric medications, call your outpatient psychiatric provider, 911, [phone] or go to the nearest emergency department.  -If suicidal thoughts recur, call your outpatient psychiatric provider, 911, [phone] or go to the nearest  emergency department.     [name], DO 05/24/2024, 9:57 AM

## 2024-05-24 NOTE — Progress Notes (Signed)
   05/23/24 2000  Psych Admission Type (Psych Patients Only)  Admission Status Involuntary  Psychosocial Assessment  Patient Complaints Anxiety  Eye Contact Fair  Facial Expression Anxious  Affect Appropriate to circumstance  Speech Logical/coherent  Interaction Assertive  Motor Activity Other (Comment) (WDL)  Appearance/Hygiene Unremarkable  Behavior Characteristics Appropriate to situation  Mood Anxious  Aggressive Behavior  Effect No apparent injury  Thought Process  Coherency WDL  Content WDL  Delusions None reported or observed  Perception WDL  Hallucination None reported or observed  Judgment Impaired  Confusion None  Danger to Self  Current suicidal ideation? Denies
